# Patient Record
Sex: Male | Born: 1965
Health system: Southern US, Community
[De-identification: ages and names within clinical notes are randomized; demographics above are authoritative.]

## PROBLEM LIST (undated history)

## (undated) DIAGNOSIS — R0683 Snoring: Secondary | ICD-10-CM

## (undated) HISTORY — DX: Snoring: R06.83

---

## 2002-03-17 ENCOUNTER — Emergency Department (HOSPITAL_COMMUNITY): Admission: EM | Admit: 2002-03-17 | Discharge: 2002-03-17 | Payer: Self-pay | Admitting: Emergency Medicine

## 2002-08-08 ENCOUNTER — Emergency Department (HOSPITAL_COMMUNITY): Admission: EM | Admit: 2002-08-08 | Discharge: 2002-08-09 | Payer: Self-pay | Admitting: Emergency Medicine

## 2013-04-27 ENCOUNTER — Encounter: Payer: Self-pay | Admitting: Family Medicine

## 2014-02-17 ENCOUNTER — Ambulatory Visit (INDEPENDENT_AMBULATORY_CARE_PROVIDER_SITE_OTHER): Payer: 59 | Admitting: Family Medicine

## 2014-02-17 VITALS — BP 140/82 | HR 70 | Temp 98.0°F | Resp 16 | Ht 69.0 in | Wt 190.0 lb

## 2014-02-17 DIAGNOSIS — J209 Acute bronchitis, unspecified: Secondary | ICD-10-CM

## 2014-02-17 MED ORDER — HYDROCOD POLST-CHLORPHEN POLST 10-8 MG/5ML PO LQCR: 5.0000 mL | Freq: Two times a day (BID) | ORAL | Status: DC | PRN

## 2014-02-17 MED ORDER — AZITHROMYCIN 250 MG PO TABS: ORAL_TABLET | ORAL | Status: DC

## 2014-02-17 NOTE — Patient Instructions (Signed)

## 2014-02-17 NOTE — Progress Notes (Signed)
   Subjective:    Patient ID: Willie Ferguson, male    DOB: Mar 08, 1966, 48 y.o.   MRN: [id number] This chart was scribed for Elvina SidleKurt Pedrohenrique Mccon[place], MD by Littie Deedsichard Sun, Medical Scribe. This patient was seen in Room 2 and the patient's care was started at 8:33 AM.   HPI HPI Comments: Willie Ferguson is a 48 y.o. male who presents to the [facility] complaining of gradual onset URI symptoms that began 6 days ago. He initially had a sore throat, then developed a cough that began last night. Patient has tried Halls and Mucinex for his symptoms. He notes that his wife has also been experiencing similar symptoms. He denies fever, chills, diaphoresis. He denies smoking and hx of lung problems.  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    Review of Systems  Constitutional: Negative for fever, chills and diaphoresis.  HENT: Positive for sore throat.   Eyes: Negative for discharge.  Respiratory: Positive for cough.   Cardiovascular: Negative for chest pain.  Genitourinary: Negative for hematuria.  Musculoskeletal: Negative for back pain.  Skin: Negative for rash.  Neurological: Negative for seizures.  Psychiatric/Behavioral: Negative for hallucinations.       Objective:   Physical Exam CONSTITUTIONAL: Well developed/well nourished HEAD: Normocephalic/atraumatic EYES: EOM/PERRL ENMT: Mucous membranes moist. Patchy erythema on hard palate. NECK: supple no meningeal signs SPINE: entire spine nontender CV: S1/S2 noted, no murmurs/rubs/gallops noted LUNGS: Bibasilar rales ABDOMEN: soft, nontender, no rebound or guarding GU: no cva tenderness NEURO: Pt is awake/alert, moves all extremitiesx4 EXTREMITIES: pulses normal, full ROM SKIN: warm, color normal.  PSYCH: no abnormalities of mood noted        Assessment & Plan:   Acute bronchitis, unspecified organism - Plan: chlorpheniramine-HYDROcodone (TUSSIONEX PENNKINETIC ER) 10-8 MG/5ML LQCR, azithromycin (ZITHROMAX Z-PAK) 250  MG tablet  Signed, Elvina SidleKurt Jahrell Hamor, MD

## 2014-08-25 ENCOUNTER — Ambulatory Visit (INDEPENDENT_AMBULATORY_CARE_PROVIDER_SITE_OTHER): Payer: 59

## 2014-08-25 ENCOUNTER — Ambulatory Visit (INDEPENDENT_AMBULATORY_CARE_PROVIDER_SITE_OTHER): Payer: 59 | Admitting: Family Medicine

## 2014-08-25 VITALS — BP 134/80 | HR 65 | Temp 97.9°F | Resp 18 | Ht 67.0 in | Wt 195.0 lb

## 2014-08-25 DIAGNOSIS — M25512 Pain in left shoulder: Secondary | ICD-10-CM

## 2014-08-25 DIAGNOSIS — M7542 Impingement syndrome of left shoulder: Secondary | ICD-10-CM

## 2014-08-25 DIAGNOSIS — M7522 Bicipital tendinitis, left shoulder: Secondary | ICD-10-CM

## 2014-08-25 MED ORDER — MELOXICAM 15 MG PO TABS: 15.0000 mg | ORAL_TABLET | Freq: Every day | ORAL | Status: DC

## 2014-08-25 NOTE — Patient Instructions (Addendum)
Ice the shoulder 3-4 times per day for 15 minutes.  Make sure you use a barrier between the skin and ice. Please take the mobic daily.  Do not use other NSAIDs with it such as ibuprofen, naproxen, etc. This is a process that improves gradually, so stick to your stretches and exercise.     Impingement Syndrome, Rotator Cuff, Bursitis with Rehab Impingement syndrome is a condition that involves inflammation of the tendons of the rotator cuff and the subacromial bursa, that causes pain in the shoulder. The rotator cuff consists of four tendons and muscles that control much of the shoulder and upper arm function. The subacromial bursa is a fluid filled sac that helps reduce friction between the rotator cuff and one of the bones of the shoulder (acromion). Impingement syndrome is usually an overuse injury that causes swelling of the bursa (bursitis), swelling of the tendon (tendonitis), and/or a tear of the tendon (strain). Strains are classified into three categories. Grade 1 strains cause pain, but the tendon is not lengthened. Grade 2 strains include a lengthened ligament, due to the ligament being stretched or partially ruptured. With grade 2 strains there is still function, although the function may be decreased. Grade 3 strains include a complete tear of the tendon or muscle, and function is usually impaired. SYMPTOMS   Pain around the shoulder, often at the outer portion of the upper arm.  Pain that gets worse with shoulder function, especially when reaching overhead or lifting.  Sometimes, aching when not using the arm.  Pain that wakes you up at night.  Sometimes, tenderness, swelling, warmth, or redness over the affected area.  Loss of strength.  Limited motion of the shoulder, especially reaching behind the back (to the back pocket or to unhook bra) or across your body.  Crackling sound (crepitation) when moving the arm.  Biceps tendon pain and inflammation (in the front of the  shoulder). Worse when bending the elbow or lifting. CAUSES  Impingement syndrome is often an overuse injury, in which chronic (repetitive) motions cause the tendons or bursa to become inflamed. A strain occurs when a force is paced on the tendon or muscle that is greater than it can withstand. Common mechanisms of injury include: Stress from sudden increase in duration, frequency, or intensity of training.  Direct hit (trauma) to the shoulder.  Aging, erosion of the tendon with normal use.  Bony bump on shoulder (acromial spur). RISK INCREASES WITH:  Contact sports (football, wrestling, boxing).  Throwing sports (baseball, tennis, volleyball).  Weightlifting and bodybuilding.  Heavy labor.  Previous injury to the rotator cuff, including impingement.  Poor shoulder strength and flexibility.  Failure to warm up properly before activity.  Inadequate protective equipment.  Old age.  Bony bump on shoulder (acromial spur). PREVENTION   Warm up and stretch properly before activity.  Allow for adequate recovery between workouts.  Maintain physical fitness:  Strength, flexibility, and endurance.  Cardiovascular fitness.  Learn and use proper exercise technique. PROGNOSIS  If treated properly, impingement syndrome usually goes away within 6 weeks. Sometimes surgery is required.  RELATED COMPLICATIONS   Longer healing time if not properly treated, or if not given enough time to heal.  Recurring symptoms, that result in a chronic condition.  Shoulder stiffness, frozen shoulder, or loss of motion.  Rotator cuff tendon tear.  Recurring symptoms, especially if activity is resumed too soon, with overuse, with a direct blow, or when using poor technique. TREATMENT  Treatment first involves the use  of ice and medicine, to reduce pain and inflammation. The use of strengthening and stretching exercises may help reduce pain with activity. These exercises may be performed at home  or with a therapist. If non-surgical treatment is unsuccessful after more than 6 months, surgery may be advised. After surgery and rehabilitation, activity is usually possible in 3 months.  MEDICATION  If pain medicine is needed, nonsteroidal anti-inflammatory medicines (aspirin and ibuprofen), or other minor pain relievers (acetaminophen), are often advised.  Do not take pain medicine for 7 days before surgery.  Prescription pain relievers may be given, if your caregiver thinks they are needed. Use only as directed and only as much as you need.  Corticosteroid injections may be given by your caregiver. These injections should be reserved for the most serious cases, because they may only be given a certain number of times. HEAT AND COLD  Cold treatment (icing) should be applied for 10 to 15 minutes every 2 to 3 hours for inflammation and pain, and immediately after activity that aggravates your symptoms. Use ice packs or an ice massage.  Heat treatment may be used before performing stretching and strengthening activities prescribed by your caregiver, physical therapist, or athletic trainer. Use a heat pack or a warm water soak. SEEK MEDICAL CARE IF:   Symptoms get worse or do not improve in 4 to 6 weeks, despite treatment.  New, unexplained symptoms develop. (Drugs used in treatment may produce side effects.) EXERCISES  RANGE OF MOTION (ROM) AND STRETCHING EXERCISES - Impingement Syndrome (Rotator Cuff  Tendinitis, Bursitis) These exercises may help you when beginning to rehabilitate your injury. Your symptoms may go away with or without further involvement from your physician, physical therapist or athletic trainer. While completing these exercises, remember:   Restoring tissue flexibility helps normal motion to return to the joints. This allows healthier, less painful movement and activity.  An effective stretch should be held for at least 30 seconds.  A stretch should never be  painful. You should only feel a gentle lengthening or release in the stretched tissue. STRETCH - Flexion, Standing  Stand with good posture. With an underhand grip on your right / left hand, and an overhand grip on the opposite hand, grasp a broomstick or cane so that your hands are a [place] more than shoulder width apart.  Keeping your right / left elbow straight and shoulder muscles relaxed, push the stick with your opposite hand, to raise your right / left arm in front of your body and then overhead. Raise your arm until you feel a stretch in your right / left shoulder, but before you have increased shoulder pain.  Try to avoid shrugging your right / left shoulder as your arm rises, by keeping your shoulder blade tucked down and toward your mid-back spine. Hold for __________ seconds.  Slowly return to the starting position. Repeat __________ times. Complete this exercise __________ times per day. STRETCH - Abduction, Supine  Lie on your back. With an underhand grip on your right / left hand and an overhand grip on the opposite hand, grasp a broomstick or cane so that your hands are a [place] more than shoulder width apart.  Keeping your right / left elbow straight and your shoulder muscles relaxed, push the stick with your opposite hand, to raise your right / left arm out to the side of your body and then overhead. Raise your arm until you feel a stretch in your right / left shoulder, but before you have  increased shoulder pain.  Try to avoid shrugging your right / left shoulder as your arm rises, by keeping your shoulder blade tucked down and toward your mid-back spine. Hold for __________ seconds.  Slowly return to the starting position. Repeat __________ times. Complete this exercise __________ times per day. ROM - Flexion, Active-Assisted  Lie on your back. You may bend your knees for comfort.  Grasp a broomstick or cane so your hands are about shoulder width apart. Your right / left  hand should grip the end of the stick, so that your hand is positioned "thumbs-up," as if you were about to shake hands.  Using your healthy arm to lead, raise your right / left arm overhead, until you feel a gentle stretch in your shoulder. Hold for __________ seconds.  Use the stick to assist in returning your right / left arm to its starting position. Repeat __________ times. Complete this exercise __________ times per day.  ROM - Internal Rotation, Supine   Lie on your back on a firm surface. Place your right / left elbow about 60 degrees away from your side. Elevate your elbow with a folded towel, so that the elbow and shoulder are the same height.  Using a broomstick or cane and your strong arm, pull your right / left hand toward your body until you feel a gentle stretch, but no increase in your shoulder pain. Keep your shoulder and elbow in place throughout the exercise.  Hold for __________ seconds. Slowly return to the starting position. Repeat __________ times. Complete this exercise __________ times per day. STRETCH - Internal Rotation  Place your right / left hand behind your back, palm up.  Throw a towel or belt over your opposite shoulder. Grasp the towel with your right / left hand.  While keeping an upright posture, gently pull up on the towel, until you feel a stretch in the front of your right / left shoulder.  Avoid shrugging your right / left shoulder as your arm rises, by keeping your shoulder blade tucked down and toward your mid-back spine.  Hold for __________ seconds. Release the stretch, by lowering your healthy hand. Repeat __________ times. Complete this exercise __________ times per day. ROM - Internal Rotation   Using an underhand grip, grasp a stick behind your back with both hands.  While standing upright with good posture, slide the stick up your back until you feel a mild stretch in the front of your shoulder.  Hold for __________ seconds. Slowly  return to your starting position. Repeat __________ times. Complete this exercise __________ times per day.  STRETCH - Posterior Shoulder Capsule   Stand or sit with good posture. Grasp your right / left elbow and draw it across your chest, keeping it at the same height as your shoulder.  Pull your elbow, so your upper arm comes in closer to your chest. Pull until you feel a gentle stretch in the back of your shoulder.  Hold for __________ seconds. Repeat __________ times. Complete this exercise __________ times per day. STRENGTHENING EXERCISES - Impingement Syndrome (Rotator Cuff Tendinitis, Bursitis) These exercises may help you when beginning to rehabilitate your injury. They may resolve your symptoms with or without further involvement from your physician, physical therapist or athletic trainer. While completing these exercises, remember:  Muscles can gain both the endurance and the strength needed for everyday activities through controlled exercises.  Complete these exercises as instructed by your physician, physical therapist or athletic trainer. Increase the resistance and  repetitions only as guided.  You may experience muscle soreness or fatigue, but the pain or discomfort you are trying to eliminate should never worsen during these exercises. If this pain does get worse, stop and make sure you are following the directions exactly. If the pain is still present after adjustments, discontinue the exercise until you can discuss the trouble with your clinician.  During your recovery, avoid activity or exercises which involve actions that place your injured hand or elbow above your head or behind your back or head. These positions stress the tissues which you are trying to heal. STRENGTH - Scapular Depression and Adduction   With good posture, sit on a firm chair. Support your arms in front of you, with pillows, arm rests, or on a table top. Have your elbows in line with the sides of your  body.  Gently draw your shoulder blades down and toward your mid-back spine. Gradually increase the tension, without tensing the muscles along the top of your shoulders and the back of your neck.  Hold for __________ seconds. Slowly release the tension and relax your muscles completely before starting the next repetition.  After you have practiced this exercise, remove the arm support and complete the exercise in standing as well as sitting position. Repeat __________ times. Complete this exercise __________ times per day.  STRENGTH - Shoulder Abductors, Isometric  With good posture, stand or sit about 4-6 inches from a wall, with your right / left side facing the wall.  Bend your right / left elbow. Gently press your right / left elbow into the wall. Increase the pressure gradually, until you are pressing as hard as you can, without shrugging your shoulder or increasing any shoulder discomfort.  Hold for __________ seconds.  Release the tension slowly. Relax your shoulder muscles completely before you begin the next repetition. Repeat __________ times. Complete this exercise __________ times per day.  STRENGTH - External Rotators, Isometric  Keep your right / left elbow at your side and bend it 90 degrees.  Step into a door frame so that the outside of your right / left wrist can press against the door frame without your upper arm leaving your side.  Gently press your right / left wrist into the door frame, as if you were trying to swing the back of your hand away from your stomach. Gradually increase the tension, until you are pressing as hard as you can, without shrugging your shoulder or increasing any shoulder discomfort.  Hold for __________ seconds.  Release the tension slowly. Relax your shoulder muscles completely before you begin the next repetition. Repeat __________ times. Complete this exercise __________ times per day.  STRENGTH - Supraspinatus   Stand or sit with good  posture. Grasp a __________ weight, or an exercise band or tubing, so that your hand is "thumbs-up," like you are shaking hands.  Slowly lift your right / left arm in a "V" away from your thigh, diagonally into the space between your side and straight ahead. Lift your hand to shoulder height or as far as you can, without increasing any shoulder pain. At first, many people do not lift their hands above shoulder height.  Avoid shrugging your right / left shoulder as your arm rises, by keeping your shoulder blade tucked down and toward your mid-back spine.  Hold for __________ seconds. Control the descent of your hand, as you slowly return to your starting position. Repeat __________ times. Complete this exercise __________ times per day.  STRENGTH - External Rotators  Secure a rubber exercise band or tubing to a fixed object (table, pole) so that it is at the same height as your right / left elbow when you are standing or sitting on a firm surface.  Stand or sit so that the secured exercise band is at your uninjured side.  Bend your right / left elbow 90 degrees. Place a folded towel or small pillow under your right / left arm, so that your elbow is a few inches away from your side.  Keeping the tension on the exercise band, pull it away from your body, as if pivoting on your elbow. Be sure to keep your body steady, so that the movement is coming only from your rotating shoulder.  Hold for __________ seconds. Release the tension in a controlled manner, as you return to the starting position. Repeat __________ times. Complete this exercise __________ times per day.  STRENGTH - Internal Rotators   Secure a rubber exercise band or tubing to a fixed object (table, pole) so that it is at the same height as your right / left elbow when you are standing or sitting on a firm surface.  Stand or sit so that the secured exercise band is at your right / left side.  Bend your elbow 90 degrees. Place a  folded towel or small pillow under your right / left arm so that your elbow is a few inches away from your side.  Keeping the tension on the exercise band, pull it across your body, toward your stomach. Be sure to keep your body steady, so that the movement is coming only from your rotating shoulder.  Hold for __________ seconds. Release the tension in a controlled manner, as you return to the starting position. Repeat __________ times. Complete this exercise __________ times per day.  STRENGTH - Scapular Protractors, Standing   Stand arms length away from a wall. Place your hands on the wall, keeping your elbows straight.  Begin by dropping your shoulder blades down and toward your mid-back spine.  To strengthen your protractors, keep your shoulder blades down, but slide them forward on your rib cage. It will feel as if you are lifting the back of your rib cage away from the wall. This is a subtle motion and can be challenging to complete. Ask your caregiver for further instruction, if you are not sure you are doing the exercise correctly.  Hold for __________ seconds. Slowly return to the starting position, resting the muscles completely before starting the next repetition. Repeat __________ times. Complete this exercise __________ times per day. STRENGTH - Scapular Protractors, Supine  Lie on your back on a firm surface. Extend your right / left arm straight into the air while holding a __________ weight in your hand.  Keeping your head and back in place, lift your shoulder off the floor.  Hold for __________ seconds. Slowly return to the starting position, and allow your muscles to relax completely before starting the next repetition. Repeat __________ times. Complete this exercise __________ times per day. STRENGTH - Scapular Protractors, Quadruped  Get onto your hands and knees, with your shoulders directly over your hands (or as close as you can be, comfortably).  Keeping your  elbows locked, lift the back of your rib cage up into your shoulder blades, so your mid-back rounds out. Keep your neck muscles relaxed.  Hold this position for __________ seconds. Slowly return to the starting position and allow your muscles to relax completely  before starting the next repetition. Repeat __________ times. Complete this exercise __________ times per day.  STRENGTH - Scapular Retractors  Secure a rubber exercise band or tubing to a fixed object (table, pole), so that it is at the height of your shoulders when you are either standing, or sitting on a firm armless chair.  With a palm down grip, grasp an end of the band in each hand. Straighten your elbows and lift your hands straight in front of you, at shoulder height. Step back, away from the secured end of the band, until it becomes tense.  Squeezing your shoulder blades together, draw your elbows back toward your sides, as you bend them. Keep your upper arms lifted away from your body throughout the exercise.  Hold for __________ seconds. Slowly ease the tension on the band, as you reverse the directions and return to the starting position. Repeat __________ times. Complete this exercise __________ times per day. STRENGTH - Shoulder Extensors   Secure a rubber exercise band or tubing to a fixed object (table, pole) so that it is at the height of your shoulders when you are either standing, or sitting on a firm armless chair.  With a thumbs-up grip, grasp an end of the band in each hand. Straighten your elbows and lift your hands straight in front of you, at shoulder height. Step back, away from the secured end of the band, until it becomes tense.  Squeezing your shoulder blades together, pull your hands down to the sides of your thighs. Do not allow your hands to go behind you.  Hold for __________ seconds. Slowly ease the tension on the band, as you reverse the directions and return to the starting position. Repeat  __________ times. Complete this exercise __________ times per day.  STRENGTH - Scapular Retractors and External Rotators   Secure a rubber exercise band or tubing to a fixed object (table, pole) so that it is at the height as your shoulders, when you are either standing, or sitting on a firm armless chair.  With a palm down grip, grasp an end of the band in each hand. Bend your elbows 90 degrees and lift your elbows to shoulder height, at your sides. Step back, away from the secured end of the band, until it becomes tense.  Squeezing your shoulder blades together, rotate your shoulders so that your upper arms and elbows remain stationary, but your fists travel upward to head height.  Hold for __________ seconds. Slowly ease the tension on the band, as you reverse the directions and return to the starting position. Repeat __________ times. Complete this exercise __________ times per day.  STRENGTH - Scapular Retractors and External Rotators, Rowing   Secure a rubber exercise band or tubing to a fixed object (table, pole) so that it is at the height of your shoulders, when you are either standing, or sitting on a firm armless chair.  With a palm down grip, grasp an end of the band in each hand. Straighten your elbows and lift your hands straight in front of you, at shoulder height. Step back, away from the secured end of the band, until it becomes tense.  Step 1: Squeeze your shoulder blades together. Bending your elbows, draw your hands to your chest, as if you are rowing a boat. At the end of this motion, your hands and elbow should be at shoulder height and your elbows should be out to your sides.  Step 2: Rotate your shoulders, to raise your  hands above your head. Your forearms should be vertical and your upper arms should be horizontal.  Hold for __________ seconds. Slowly ease the tension on the band, as you reverse the directions and return to the starting position. Repeat __________  times. Complete this exercise __________ times per day.  STRENGTH - Scapular Depressors  Find a sturdy chair without wheels, such as a dining room chair.  Keeping your feet on the floor, and your hands on the chair arms, lift your bottom up from the seat, and lock your elbows.  Keeping your elbows straight, allow gravity to pull your body weight down. Your shoulders will rise toward your ears.  Raise your body against gravity by drawing your shoulder blades down your back, shortening the distance between your shoulders and ears. Although your feet should always maintain contact with the floor, your feet should progressively support less body weight, as you get stronger.  Hold for __________ seconds. In a controlled and slow manner, lower your body weight to begin the next repetition. Repeat __________ times. Complete this exercise __________ times per day.  Document Released: 03/24/2005 Document Revised: 06/16/2011 Document Reviewed: 07/06/2008 [facility] Patient Information 2015 [place], [place]. This information is not intended to replace advice given to you by your health care provider. Make sure you discuss any questions you have with your health care provider.

## 2014-08-25 NOTE — Progress Notes (Signed)
[facility] [address], [place] [id number] [phone]  Date:  08/25/2014   Name:  Willie Ferguson   DOB:  March 09, 1966   MRN:  [id number]  PCP:  No PCP Per Patient    Chief Complaint: Shoulder Pain   History of Present Illness:  Willie Ferguson is a 49 y.o. very pleasant male patient who presents with the following:  Patient reports left sided shoulder pain that started 4 days ago, when he woke up.  He describes as a pinching sensation that occurs intermittently.  He states that he feels some warmth, and his wife thought there was some swelling.  It is aggravated by turning a truck wheel counter clockwise and flexion.  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  He hass no extremity numbness or tingling.  He reports hand cramping.  He has tried 400-600 mg Advil tablets at night only, which helps some.    There are no active problems to display for this patient.   History reviewed. No pertinent past medical history.  History reviewed. No pertinent past surgical history.  History  Substance Use Topics  . Smoking status: Never Smoker   . Smokeless tobacco: Not on file  . Alcohol Use: Not on file    History reviewed. No pertinent family history.  No Known Allergies  Medication list has been reviewed and updated.  Current Outpatient Prescriptions on File Prior to Visit  Medication Sig Dispense Refill  . azithromycin (ZITHROMAX Z-PAK) 250 MG tablet Take as directed on pack (Patient not taking: Reported on 08/25/2014) 6 tablet 0  . chlorpheniramine-HYDROcodone (TUSSIONEX PENNKINETIC ER) 10-8 MG/5ML LQCR Take 5 mLs by mouth every 12 (twelve) hours as needed. (Patient not taking: Reported on 08/25/2014) 115 mL 0   No current facility-administered medications on file prior to visit.    Review of Systems: ROS otherwise unremarkable unless listed above.  Physical Examination: Filed Vitals:   08/25/14 0817  BP: 134/80  Pulse: 65  Temp: 97.9  F (36.6 C)  Resp: 18   Filed Vitals:   08/25/14 0817  Height: 5\' 7"  (1.702 m)  Weight: 195 lb (88.451 kg)   Body mass index is 30.53 kg/(m^2). Ideal Body Weight: Weight in (lb) to have BMI = 25: 159.3 Physical Exam  Constitutional: He is oriented to person, place, and time. He appears well-developed and well-nourished.  Eyes: EOM are normal. Pupils are equal, round, and reactive to light.  Neck: Normal range of motion. Neck supple. No spinous process tenderness present. Normal range of motion present.  Cardiovascular: Normal rate.   Pulmonary/Chest: Effort normal. No respiratory distress.  Musculoskeletal:       Right shoulder: Normal. He exhibits normal range of motion and normal strength.  Left shoulder: No observed swelling or erythema.  There is a slight warmth to the anterior shoulder at the origin of pain.  There is tendereness at the bicep's groove.  Normal range of motion throughout all planes.  He has pain with external rotation.  He has some pain incited with lift off, but no demonstrated weakness.  Onset of pain with Neer's test extended to 100 degrees.  Positive Speed's test.  Negative Hawkins.  Negative Tinel and Phalen.  Lymphadenopathy:    He has no cervical adenopathy.  Neurological: He is alert and oriented to person, place, and time.  Skin: Skin is warm and dry.  Psychiatric: He has a normal mood and affect. His behavior is normal.  .  [facility] reading (PRIMARY) by  Dr. Neva SeatGreene: [facility]  Assessment and Plan: 49 year old is here today for left shoulder pain.  Physical exam performed by Dr. Neva SeatGreene and I.  This appears to be some tendinopathy of the biceps tendon with shoulder impingement.  Strength is maintained throughout.  We will treat conservatively at this time. -I have advised to ice 3x per day for 15 minutes and as much rest to the arm as possible. -Mobic without other NSAID use. -Stretches verbally discussed and with  handout. -Pain continues after 4 weeks, will refer to ortho.   Biceps tendonitis on left  Shoulder impingement syndrome, left  Left anterior shoulder pain - Plan: DG Shoulder Left, meloxicam (MOBIC) 15 MG tablet  Trena PlattStephanie English, PA-C [facility] 5/23/20167:36 AM

## 2014-08-31 NOTE — Progress Notes (Signed)
Xray read and patient discussed and examined with Ms. English. Agree with assessment and plan of care per her note.

## 2014-12-27 ENCOUNTER — Ambulatory Visit (INDEPENDENT_AMBULATORY_CARE_PROVIDER_SITE_OTHER): Payer: Commercial Managed Care - HMO | Admitting: Family Medicine

## 2014-12-27 VITALS — BP 138/82 | HR 68 | Temp 98.6°F | Resp 18 | Ht 69.0 in | Wt 198.0 lb

## 2014-12-27 DIAGNOSIS — H6981 Other specified disorders of Eustachian tube, right ear: Secondary | ICD-10-CM

## 2014-12-27 DIAGNOSIS — R42 Dizziness and giddiness: Secondary | ICD-10-CM | POA: Diagnosis not present

## 2014-12-27 MED ORDER — FLUTICASONE PROPIONATE 50 MCG/ACT NA SUSP: 2.0000 | Freq: Every day | NASAL | Status: DC

## 2014-12-27 MED ORDER — MECLIZINE HCL 25 MG PO TABS: 25.0000 mg | ORAL_TABLET | Freq: Three times a day (TID) | ORAL | Status: DC | PRN

## 2014-12-27 NOTE — Progress Notes (Signed)
Patient ID: Willie Ferguson, male    DOB: January 26, 1966  Age: 49 y.o. MRN: [id number]  Chief Complaint  Patient presents with  . Hypertension  . Headache    x1 week now   . Dizziness    x1 week when holding head down    Subjective:   Patient is here with history of about a week or so of having some mild headaches but mostly a dizziness that is positional occasionally when he turns over in bed he notices it, most time it is usually self. He has no other major complaints. He has had a Gravelle head congestion. He was on a plane to [place] and back. He does not smoke, occasionally drinks. Not on any regular medicines. No regular OTC medications.   Current allergies, medications, problem list, past/family and social histories reviewed.  Objective:  BP 138/82 mmHg  Pulse 68  Temp(Src) 98.6 F (37 C) (Oral)  Resp 18  Ht  (1.753 m)  Wt 198 lb (89.812 kg)  BMI 29.23 kg/m2  SpO2 98%  No acute distress. TMs normal left. The right is a Dunker red along the umbo, like he had a tiny bit of bleeding into the edge of the drum. Suspicious for having had a eardrum that had pressure changes when he was flying. His throat is clear. Neck supple without nodes thyromegaly. Fundi benign. Chest clear. Heart regular without murmurs. Romberg negative.  Assessment & Plan:   Assessment: 1. Dizziness   2. Eustachian tube dysfunction, right       Plan: No orders of the defined types were placed in this encounter.    Meds ordered this encounter  Medications  . meclizine (ANTIVERT) 25 MG tablet    Sig: Take 1 tablet (25 mg total) by mouth 3 (three) times daily as needed for dizziness.    Dispense:  30 tablet    Refill:  0  . fluticasone (FLONASE) 50 MCG/ACT nasal spray    Sig: Place 2 sprays into both nostrils daily.    Dispense:  16 g    Refill:  6         Patient Instructions  Take meclizine 25 mg one half to one pill 3 times daily as needed for dizziness. This medicine may make your  [name] drowsy, so if you're driving you may want to just take one half of a pill.  Take the fluticasone nose spray 2 sprays in each nostril twice daily for 3 days, then cut back to once daily  Take acetaminophen (Tylenol) 500 mg 2 pills 3 times daily or ibuprofen 200 mg 3 pills 3 times daily as needed for headache  Return if not improving over the next week. Sooner if worse.    Return if symptoms worsen or fail to improve.   HOPPER,DAVID, MD 12/27/2014

## 2014-12-27 NOTE — Patient Instructions (Signed)
Take meclizine 25 mg one half to one pill 3 times daily as needed for dizziness. This medicine may make your Renner drowsy, so if you're driving you may want to just take one half of a pill.  Take the fluticasone nose spray 2 sprays in each nostril twice daily for 3 days, then cut back to once daily  Take acetaminophen (Tylenol) 500 mg 2 pills 3 times daily or ibuprofen 200 mg 3 pills 3 times daily as needed for headache  Return if not improving over the next week. Sooner if worse.

## 2015-02-27 ENCOUNTER — Encounter: Payer: Self-pay | Admitting: Physician Assistant

## 2015-02-27 ENCOUNTER — Ambulatory Visit (INDEPENDENT_AMBULATORY_CARE_PROVIDER_SITE_OTHER): Payer: Commercial Managed Care - HMO | Admitting: Physician Assistant

## 2015-02-27 VITALS — BP 136/94 | HR 73 | Temp 98.7°F | Resp 16 | Ht 68.0 in | Wt 202.0 lb

## 2015-02-27 DIAGNOSIS — R5383 Other fatigue: Secondary | ICD-10-CM | POA: Diagnosis not present

## 2015-02-27 DIAGNOSIS — Z131 Encounter for screening for diabetes mellitus: Secondary | ICD-10-CM

## 2015-02-27 DIAGNOSIS — Z13220 Encounter for screening for lipoid disorders: Secondary | ICD-10-CM | POA: Diagnosis not present

## 2015-02-27 DIAGNOSIS — Z125 Encounter for screening for malignant neoplasm of prostate: Secondary | ICD-10-CM

## 2015-02-27 DIAGNOSIS — R0681 Apnea, not elsewhere classified: Secondary | ICD-10-CM

## 2015-02-27 DIAGNOSIS — R030 Elevated blood-pressure reading, without diagnosis of hypertension: Secondary | ICD-10-CM

## 2015-02-27 DIAGNOSIS — Z0000 Encounter for general adult medical examination without abnormal findings: Secondary | ICD-10-CM

## 2015-02-27 DIAGNOSIS — IMO0001 Reserved for inherently not codable concepts without codable children: Secondary | ICD-10-CM

## 2015-02-27 LAB — POC MICROSCOPIC URINALYSIS (UMFC): Mucus: ABSENT

## 2015-02-27 LAB — COMPREHENSIVE METABOLIC PANEL
ALT: 28 U/L (ref 9–46)
AST: 28 U/L (ref 10–40)
Albumin: 4.1 g/dL (ref 3.6–5.1)
Alkaline Phosphatase: 36 U/L — ABNORMAL LOW (ref 40–115)
BUN: 6 mg/dL — ABNORMAL LOW (ref 7–25)
CO2: 27 mmol/L (ref 20–31)
Calcium: 9.3 mg/dL (ref 8.6–10.3)
Chloride: 103 mmol/L (ref 98–110)
Creat: 0.88 mg/dL (ref 0.60–1.35)
Glucose, Bld: 90 mg/dL (ref 65–99)
Potassium: 4.2 mmol/L (ref 3.5–5.3)
Sodium: 140 mmol/L (ref 135–146)
Total Bilirubin: 0.4 mg/dL (ref 0.2–1.2)
Total Protein: 7 g/dL (ref 6.1–8.1)

## 2015-02-27 LAB — CBC
HCT: 44.8 % (ref 39.0–52.0)
Hemoglobin: 15.6 g/dL (ref 13.0–17.0)
MCH: 29.1 pg (ref 26.0–34.0)
MCHC: 34.8 g/dL (ref 30.0–36.0)
MCV: 83.4 fL (ref 78.0–100.0)
MPV: 10.8 fL (ref 8.6–12.4)
Platelets: 214 10*3/uL (ref 150–400)
RBC: 5.37 MIL/uL (ref 4.22–5.81)
RDW: 14.3 % (ref 11.5–15.5)
WBC: 6.4 10*3/uL (ref 4.0–10.5)

## 2015-02-27 LAB — POCT URINALYSIS DIP (MANUAL ENTRY)
Bilirubin, UA: NEGATIVE
Glucose, UA: NEGATIVE
Ketones, POC UA: NEGATIVE
Leukocytes, UA: NEGATIVE
Nitrite, UA: NEGATIVE
Protein Ur, POC: NEGATIVE
Spec Grav, UA: 1.02
Urobilinogen, UA: 1
pH, UA: 5.5

## 2015-02-27 LAB — TSH: TSH: 2.211 u[IU]/mL (ref 0.350–4.500)

## 2015-02-27 LAB — LIPID PANEL
Cholesterol: 183 mg/dL (ref 125–200)
HDL: 44 mg/dL (ref >=40)
LDL Cholesterol: 114 mg/dL (ref <130)
Total CHOL/HDL Ratio: 4.2 Ratio (ref <=5.0)
Triglycerides: 123 mg/dL (ref <150)
VLDL: 25 mg/dL (ref <30)

## 2015-02-27 NOTE — Patient Instructions (Signed)
You will get a phone call to make appt for sleep study I will call you with your lab results within the next 10 days. Take your blood pressure at home three times a week and keep a record. Goal of <140/90. If consistently above goal, we may need to consider medication for blood pressure. Make an appt with me after your sleep study and we will recheck blood pressure and re-evaluate. Consider getting tetanus vaccine

## 2015-02-27 NOTE — Progress Notes (Signed)
   Subjective:    Patient ID: Willie Ferguson, male    DOB: 11-22-1965, 49 y.o.   MRN: [id number]  HPI    Review of Systems  Constitutional: Negative.   HENT: Negative.   Eyes: Negative.   Respiratory: Negative.   Cardiovascular: Negative.   Gastrointestinal: Negative.   Endocrine: Negative.   Genitourinary: Negative.   Musculoskeletal: Negative.   Skin: Negative.   Allergic/Immunologic: Negative.   Neurological: Negative.   Hematological: Negative.   Psychiatric/Behavioral: Negative.        Objective:   Physical Exam        Assessment & Plan:

## 2015-02-27 NOTE — Progress Notes (Signed)
[facility] [address], [place] [address] [phone]  Date:  02/27/2015   Name:  Willie Ferguson   DOB:  1965-12-04   MRN:  [id number]  PCP:  No PCP Per Patient    Chief Complaint: Annual Exam   History of Present Illness:  This is a 49 y.o. male with no significant PMH who is presenting for CPE.  Pt endorses being tired all the time. He feels that he sleeps well at night but during the day he feels he could nod off at any time. Wife has told him that he snores very loudly and sometimes it sounds like he's "choking". His mother has OSA.  BP a Sanluis elevated today. He has a BP monitor at home but does not take his BP regularly. He states going to Dr office makes him nervous. He denies ha, cp, sob, palps, LE, blurred vision.  Immunizations: refuses tdap and flu vaccines Dentist: yes, regular visits Eye: no vision changes, wears glasses at night when driving Diet: eats pretty healthy, drinks three 16 oz regular pepsis each day.  Exercise: only exercise is walking at work. Has a fitbit and gets 10,000 steps almost every day. Planning to start going to the gym on the weekends Fam hx: two uncles and 1 aunt with lung cancer (all smokers), HTN mom and brother. No hx CAD, DM, CVA. No hx colon or prostate cancer. His wife is worried about his fam hx cancer Sexual hx: no problems with sex Urinary hesitancy/frequency or nocturia: gets up once a night to urinate Tobacco/alcohol/substance use: no/only on holidays/no Colonoscopy: age 72 d/t blood in stool and normal, on 10 year cycle. Drives a garbage truck for work. Mood is good.  Review of Systems:  Review of Systems See CMA note  There are no active problems to display for this patient.  Prior to Admission medications   Not on File    No Known Allergies  History reviewed. No pertinent past surgical history.  Social History  Substance Use Topics  . Smoking status: Never Smoker   . Smokeless tobacco:  None  . Alcohol Use: No    Family History  Problem Relation Age of Onset  . Hypertension Mother   . Hypertension Brother   . Hypertension Brother     Medication list has been reviewed and updated.  Physical Examination:  Physical Exam  Constitutional: He is oriented to person, place, and time. He appears well-developed and well-nourished. No distress.  HENT:  Head: Normocephalic and atraumatic.  Right Ear: Hearing, tympanic membrane, external ear and ear canal normal.  Left Ear: Hearing, tympanic membrane, external ear and ear canal normal.  Nose: Nose normal.  Mouth/Throat: Uvula is midline, oropharynx is clear and moist and mucous membranes are normal.  mallampati state 3  Eyes: Conjunctivae, EOM and lids are normal. Pupils are equal, round, and reactive to light. Right eye exhibits no discharge. Left eye exhibits no discharge. No scleral icterus.  Neck: Trachea normal. Carotid bruit is not present. No thyromegaly present.  Cardiovascular: Normal rate, regular rhythm, normal heart sounds and normal pulses.   No murmur heard. Pulmonary/Chest: Effort normal and breath sounds normal. No respiratory distress. He has no wheezes. He has no rhonchi. He has no rales.  Abdominal: Soft. Normal appearance. There is no tenderness.  Musculoskeletal: Normal range of motion.  Lymphadenopathy:       Head (right side): No submental, no submandibular and no tonsillar adenopathy present.  Head (left side): No submental, no submandibular and no tonsillar adenopathy present.    He has no cervical adenopathy.  Neurological: He is alert and oriented to person, place, and time. No cranial nerve deficit. Gait normal.  Skin: Skin is warm, dry and intact. No lesion and no rash noted.  Psychiatric: He has a normal mood and affect. His speech is normal and behavior is normal. Thought content normal.   BP 138/92 mmHg  Pulse 73  Temp(Src) 98.7 F (37.1 C) (Oral)  Resp 16  Ht 5\' 8"  (1.727 m)  Wt  202 lb (91.627 kg)  BMI 30.72 kg/m2   Visual Acuity Screening   Right eye Left eye Both eyes  Without correction: 20/20 20/20 20/20   With correction:      Assessment and Plan:  1. Annual physical exam Up to date on preventative medicine. Decline flu vaccine. We discussed the importance of cutting down on soda intake and making time for exercise outside of work.  - POCT urinalysis dipstick - POCT Microscopic Urinalysis ([facility])  2. Other fatigue 3. Witness apneic spells Will refer for sleep study. - CBC - TSH - Ambulatory referral to Sleep Studies  4. Lipid screening - Lipid panel  5. Screening for diabetes mellitus - Comprehensive metabolic panel  6. Screening for prostate cancer We discussed new screening recommendations. No rectal exam, since new recommendations age DRE. Will get baseline PSA. If normal, will repeat in 5 years. No fam hx prostate cancer and no obstructive symptoms. - PSA  7. Elevated BP Mildly elevated BP today. Advised he check his BP at home and keep record. If consistently >140/90, he will let me know. If he has OSA, starting on CPAP could correct this. He will make appt with me after his sleep study and will recheck BP.   Roswell MinersNicole V. Dyke BrackettBush, PA-C, MHS [facility]  03/03/2015

## 2015-02-28 LAB — PSA: PSA: 0.53 ng/mL (ref <=4.00)

## 2015-03-03 DIAGNOSIS — I10 Essential (primary) hypertension: Secondary | ICD-10-CM | POA: Insufficient documentation

## 2015-03-03 DIAGNOSIS — R0681 Apnea, not elsewhere classified: Secondary | ICD-10-CM | POA: Insufficient documentation

## 2015-03-03 DIAGNOSIS — R030 Elevated blood-pressure reading, without diagnosis of hypertension: Secondary | ICD-10-CM

## 2015-03-21 ENCOUNTER — Encounter: Payer: Self-pay | Admitting: Neurology

## 2015-03-21 ENCOUNTER — Ambulatory Visit (INDEPENDENT_AMBULATORY_CARE_PROVIDER_SITE_OTHER): Payer: Commercial Managed Care - HMO | Admitting: Neurology

## 2015-03-21 VITALS — BP 132/96 | HR 82 | Resp 20 | Ht 68.0 in | Wt 200.0 lb

## 2015-03-21 DIAGNOSIS — R0683 Snoring: Secondary | ICD-10-CM

## 2015-03-21 DIAGNOSIS — G4726 Circadian rhythm sleep disorder, shift work type: Secondary | ICD-10-CM

## 2015-03-21 NOTE — Progress Notes (Signed)
SLEEP MEDICINE CLINIC   Provider:  Melvyn Novas, M D  Referring Provider: Overton Mam Primary Care Physician: [address] [facility]  Chief Complaint  Patient presents with  . New Patient (Initial Visit)    wife reports loud snoring and witnessed apnea, rm 10, alone    HPI:  Willie Ferguson is a 49 y.o. male , seen here as a referral from [place] for a sleep evaluation,    Chief complaint according to patient : " waking up choking"   [name] reports that his wife has for long time certainly several years told him that he snores and has crescendo snoring his snoring getting louder and then finally ending up in a gasp or choking for air. This does not always wakes him up but his wife. 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 He works 14 hours daily. Often has trouble staying awake watching TV or having enough energy to do any kind of social activity.  Sleep habits are as follows: His regular job begins at 7 AM and usually ends between 3 and 4 PM. He still needs to be available until 5 PM for any kind of request duties and he stated that he spends that time in his truck and often falls asleep. By the time he is home which is after 5 he will have dinner and then returns to his part-time job from there. That usually goes from 5:30 PM to 9 PM. His second job is physically demanding; he cleans up, vacuums and removes trash. He is on finally back home at 10 PM and by 11 AM he is fast asleep. Often in front of the TV. The bedroom is described as cool, dark and usually quiet. The couple just the bedroom. The dog is not entering the bedroom. He prefers to sleep on his side, he usually resumes a prone position later during  Sleep. He sleeps with 2 pillows.  His wife has noted that there is a positional component to his snoring and she is bothered when he rolls  toward reports her.  He may be loudest in supine or on his side.  Sleep medical history and family sleep history:  Family history of hypertension, his older brother noticed during his childhood that he fought in his sleep or was active thrashing about. He was not known to sleep walk.  Social history: He drinks 3 caffeinated sodas per day, he drinks iced and sweet tea every day, no coffee. Average  of 6 caffeinated beverages daily. No tobacco use, no ETOH .   Review of Systems: Out of a complete 14 system review, the patient complains of only the following symptoms, and all other reviewed systems are negative.   Epworth score 11 , Fatigue severity score 9  , depression score 0   Social History   Social History  . Marital Status: Single    Spouse Name: N/A  . Number of Children: N/A  . Years of Education: N/A   Occupational History  .  [employer]   Social History Main Topics  . Smoking status: Never Smoker   . Smokeless tobacco: Not on file  . Alcohol Use: No  . Drug Use: No  . Sexual Activity: Not on file   Other Topics Concern  . Not on file   Social History Narrative  Drinks 3 16oz sodas daily.    Family History  Problem Relation Age of Onset  . Hypertension Mother   . Hypertension Brother   . Hypertension Brother     Past Medical History  Diagnosis Date  . Snoring     History reviewed. No pertinent past surgical history.  No current outpatient prescriptions on file.   No current facility-administered medications for this visit.    Allergies as of 03/21/2015  . (No Known Allergies)    Vitals: BP 132/96 mmHg  Pulse 82  Resp 20  Ht 5\' 8"  (1.727 m)  Wt 200 lb (90.719 kg)  BMI 30.42 kg/m2 Last Weight:  Wt Readings from Last 1 Encounters:  03/21/15 200 lb (90.719 kg)   NWG:NFAOBMI:Body mass index is 30.42 kg/(m^2).     Last Height:   Ht Readings from Last 1 Encounters:  03/21/15 5\' 8"  (1.727 m)    Physical exam:  General: The patient is  awake, alert and appears not in acute distress. The patient is well groomed. Head: Normocephalic, atraumatic. Neck is supple. Mallampati 4,  neck circumference: 16.25. Nasal airflow unrestricted , TMJ click is not evident . Retrognathia is seen.  Cardiovascular:  Regular rate and rhythm , without  murmurs or carotid bruit, and without distended neck veins. Respiratory: Lungs are clear to auscultation. Skin:  Without evidence of edema, or rash Trunk: BMI is elevated . The patient's posture is erect .  Neurologic exam : The patient is awake and alert, oriented to place and time.   Memory subjective described as intact.  Attention span & concentration ability appears normal.  Speech is fluent, without dysarthria, dysphonia or aphasia.  Mood and affect are appropriate.  Cranial nerves: Pupils are equal and briskly reactive to light. Funduscopic exam without evidence of pallor or edema.  Extraocular movements  in vertical and horizontal planes intact and without nystagmus. Visual fields by finger perimetry are intact. Hearing to finger rub intact.   Facial sensation intact to fine touch.  Facial motor strength is symmetric and tongue and uvula move midline. Shoulder shrug was symmetrical.   Motor exam:  Normal tone, muscle bulk and symmetric strength in all extremities.  Sensory:  Fine touch, pinprick and vibration were tested in all extremities. Proprioception tested in the upper extremities was normal.  Coordination: Rapid alternating movements in the fingers/hands was normal. Finger-to-nose maneuver  normal without evidence of ataxia, dysmetria or tremor.  Gait and station: Patient walks without assistive device and is able unassisted to climb up to the exam table. Strength within normal limits.  Stance is stable and normal.  Toe and heel stand were tested. Tandem gait is unfragmented. Turns with 3Steps. Romberg testing is negative.  Deep tendon reflexes: in the  upper and lower  extremities are symmetric and intact. Babinski maneuver response is downgoing.  The patient was advised of the nature of the diagnosed sleep disorder , the treatment options and risks for general a health and wellness arising from not treating the condition.  I spent more than 30 minutes of face to face time with the patient. Greater than 50% of time was spent in counseling and coordination of care. We have discussed the diagnosis and differential and I answered the patient's questions.     Assessment:  After physical and neurologic examination, review of laboratory studies,  Personal review of imaging studies, reports of other /same  Imaging studies ,  Results of polysomnography/ neurophysiology testing and pre-existing records as far as provided  in visit., my assessment is   1) Mr. Grave is not fatigued but he gets sleepy in the afternoon and evening hours.   Part of this is not because his sleep quality is poor but because he works long hours.  But his wife has noticed crescendo snoring and even witnessed some apneic gaps is the most reliable information to validate the need for a sleep test. Asian has no significant comorbidities or surgical history. I think that a home sleep test to screen him for apnea as the most reasonable next step. Is a home sleep test returns with a significant degree of apnea we can either outdoor titrate the patient or if he is not in favor of CPAP he may be a candidate for a dental device depending on the outcome of the test.   Given his rather benign sleep history I do not think that an attended sleep study as needed. He does not have frequent headaches, he does not wake up this diaphoresis office palpitations. And again he has not felt that his work performance as of anyway affected. He has no history of respiratory distress. No wheezing no rhonchi.   Plan:  Treatment plan and additional workup :  HST- RV after test.    Melvyn Novas MD  03/21/2015   CC:  Dorna Leitz, Pa-c [address] [place], [place] [address]

## 2015-03-21 NOTE — Patient Instructions (Signed)

## 2017-01-13 IMAGING — CR DG SHOULDER 2+V*L*
3 series · 3 of 3 positions shown · non-contrast
Comparison: None.

CLINICAL DATA: Shoulder pain of unknown origin. Initial evaluation
.

EXAM:
LEFT SHOULDER - 2+ VIEW

[AP (1 of 2)]
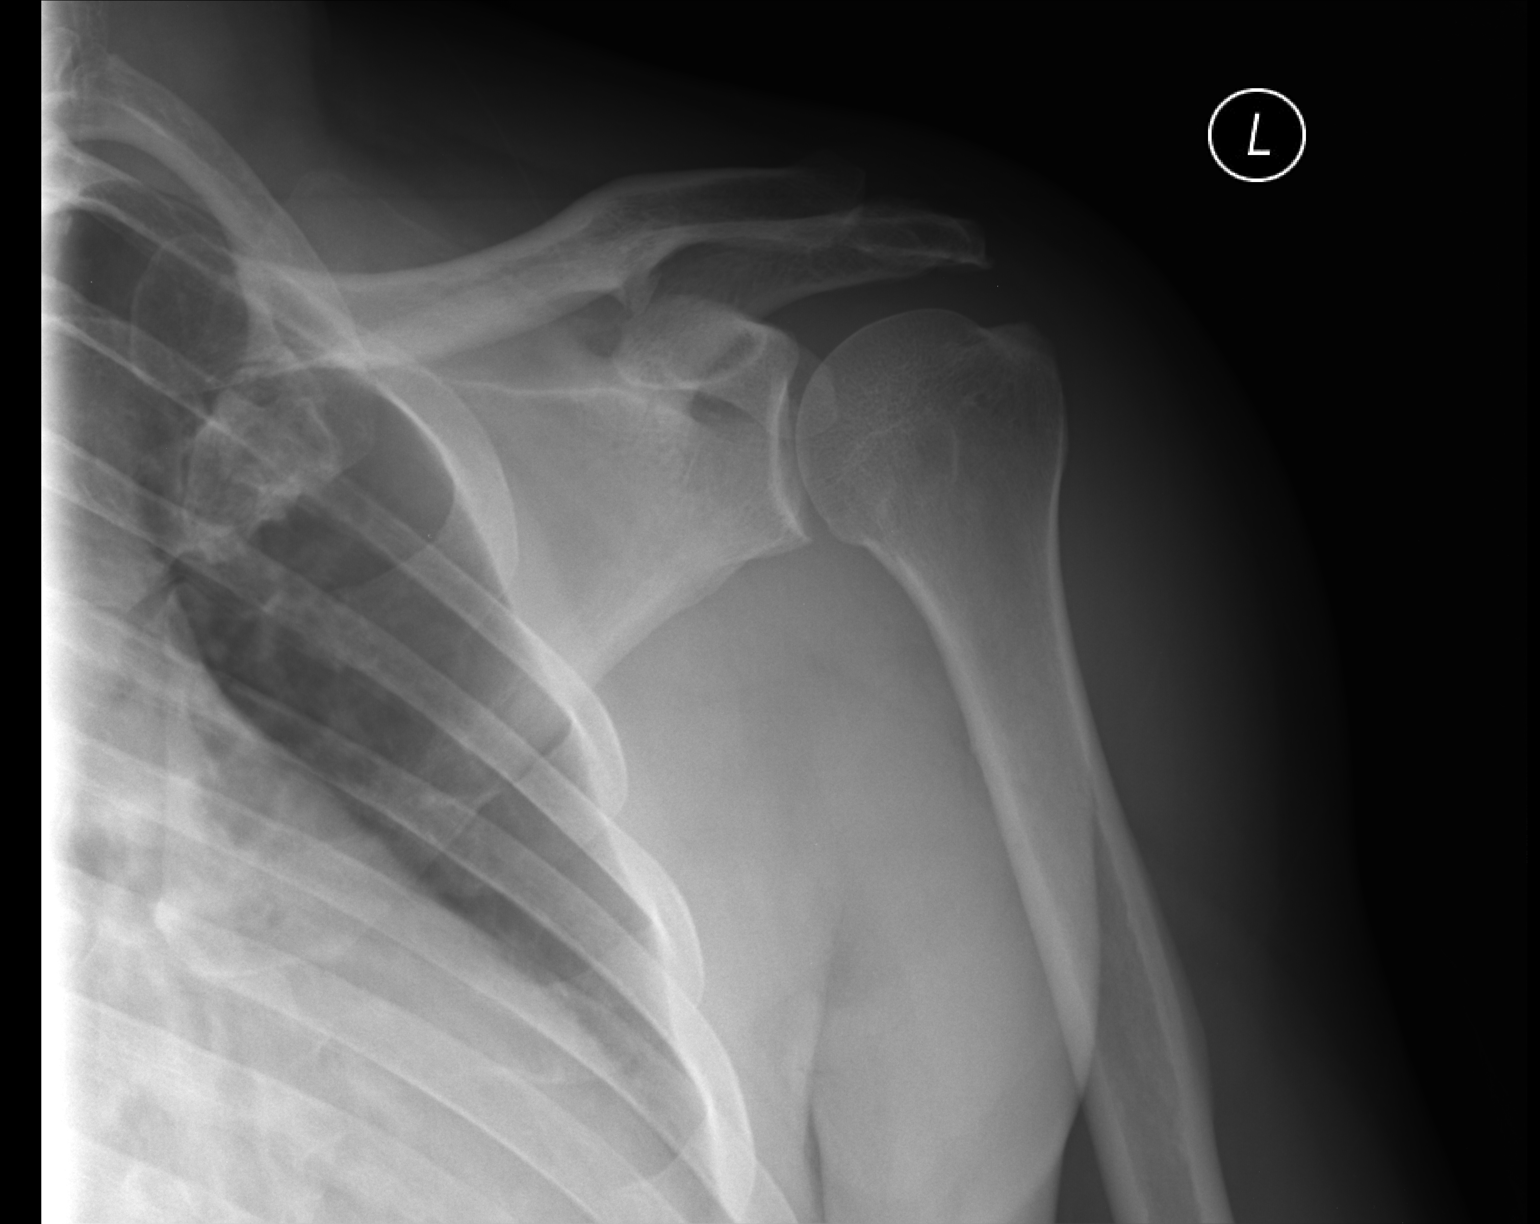

[axial]
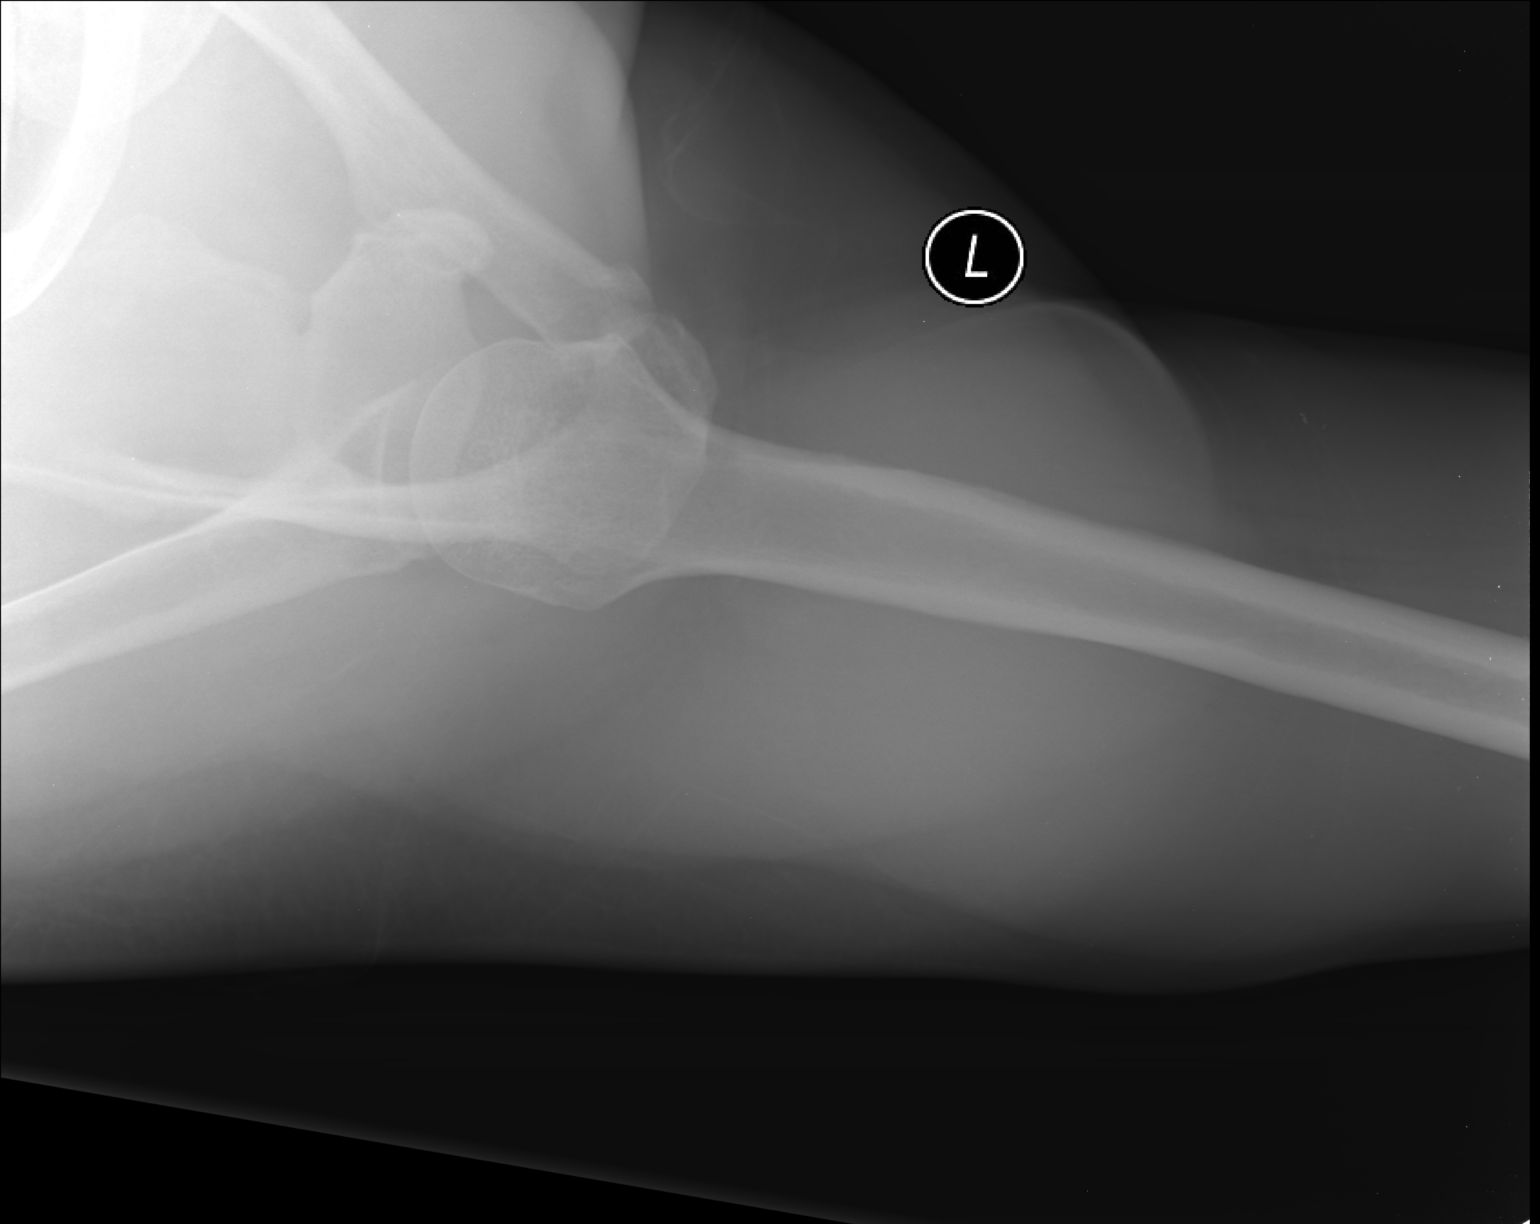

[AP (2 of 2)]
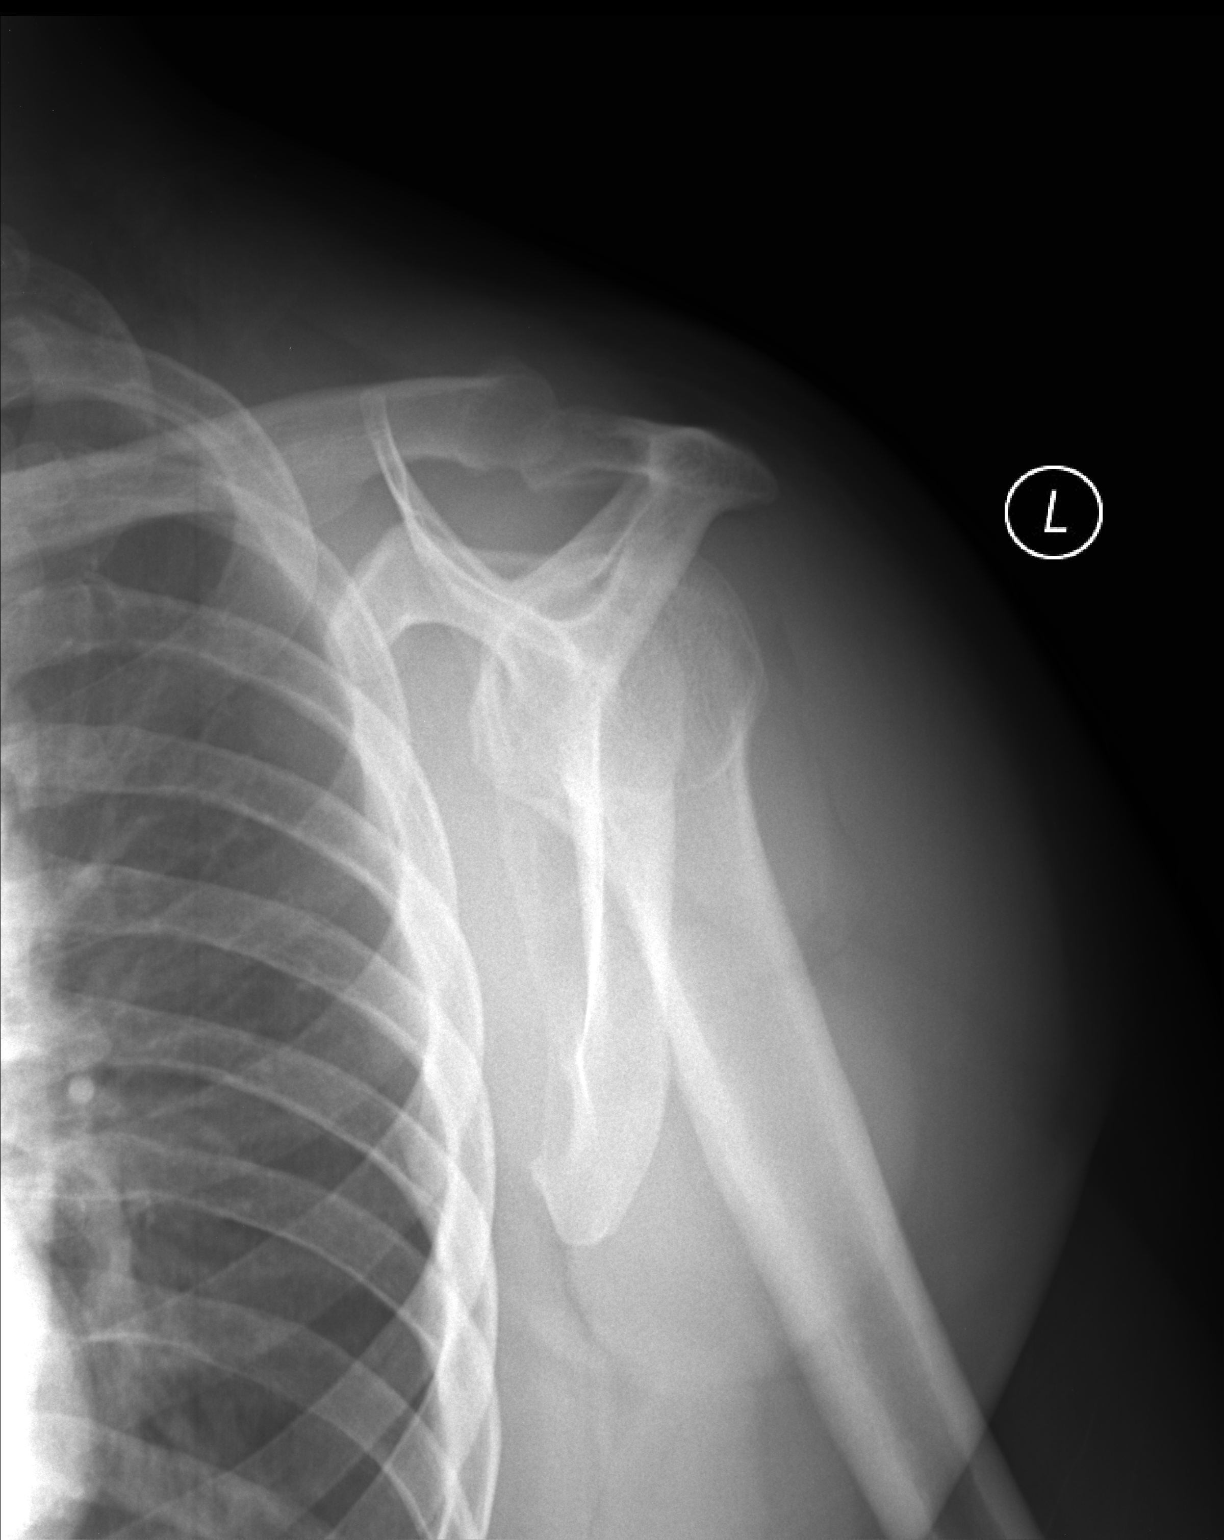

[3 of 3 positions shown; findings below may reference images not displayed]

FINDINGS: Mild acromioclavicular glenohumeral degenerative change. No evidence
of fracture, dislocation, or separation.
IMPRESSION: Mild acromioclavicular and glenohumeral degenerative change. No
acute abnormality identified.

## 2017-03-13 ENCOUNTER — Other Ambulatory Visit: Payer: Self-pay

## 2017-03-13 ENCOUNTER — Encounter (HOSPITAL_COMMUNITY): Payer: Self-pay

## 2017-03-13 ENCOUNTER — Ambulatory Visit (HOSPITAL_COMMUNITY)
Admission: EM | Admit: 2017-03-13 | Discharge: 2017-03-13 | Disposition: A | Discharge status: Home / Self Care | Payer: 59 | Attending: Internal Medicine | Admitting: Internal Medicine

## 2017-03-13 DIAGNOSIS — R059 Cough, unspecified: Secondary | ICD-10-CM

## 2017-03-13 DIAGNOSIS — R05 Cough: Secondary | ICD-10-CM

## 2017-03-13 MED ORDER — BENZONATATE 100 MG PO CAPS: 100.0000 mg | ORAL_CAPSULE | Freq: Three times a day (TID) | ORAL | 0 refills | Status: AC

## 2017-03-13 NOTE — ED Notes (Signed)
Patient discharged by provider.

## 2017-03-13 NOTE — ED Triage Notes (Signed)
[facility] productive cough and chills x3 days, pt has taken OTC medications but has no relief

## 2017-03-13 NOTE — ED Provider Notes (Signed)
[facility]    CSN: [id number] Arrival date & time: 03/13/17  1702     History   Chief Complaint Chief Complaint  Patient presents with  . Cough    HPI Audree CamelLarry Minella is a 51 y.o. male presenting with cough for 3 days. Today he started to have an itchy throat. Cough worse at night. Tried cough relief once. No other symptoms, Denies congestion, sore throat, shortness of breath, head ache, eye pain, chest pain, abdominal pain. Nausea, vomiting. No history of reflex, does not associate increase in cough/throat irritation to meals.   HPI  Past Medical History:  Diagnosis Date  . Snoring     Patient Active Problem List   Diagnosis Date Noted  . Elevated BP 03/03/2015  . Witnessed apneic spells 03/03/2015    History reviewed. No pertinent surgical history.     Home Medications    Prior to Admission medications   Medication Sig Start Date End Date Taking? Authorizing Provider  benzonatate (TESSALON) 100 MG capsule Take 1 capsule (100 mg total) by mouth every 8 (eight) hours for 7 days. 03/13/17 03/20/17  Wieters, Junius CreamerHallie C, PA-C    Family History Family History  Problem Relation Age of Onset  . Hypertension Mother   . Hypertension Brother   . Hypertension Brother     Social History Social History   Tobacco Use  . Smoking status: Never Smoker  . Smokeless tobacco: Never Used  Substance Use Topics  . Alcohol use: No    Alcohol/week: 0.0 oz  . Drug use: No     Allergies   Patient has no known allergies.   Review of Systems Review of Systems  Constitutional: Negative for chills, fatigue and fever.  HENT: Positive for voice change. Negative for congestion, ear pain, sinus pressure, sore throat and trouble swallowing.        Itchy throat  Eyes: Negative for pain, itching and visual disturbance.  Respiratory: Positive for cough. Negative for shortness of breath.   Cardiovascular: Negative for chest pain and palpitations.  Gastrointestinal:  Negative for abdominal pain, diarrhea, nausea and vomiting.  Musculoskeletal: Negative for back pain and neck pain.  Skin: Negative for rash.  Neurological: Negative for dizziness, syncope, light-headedness and headaches.  All other systems reviewed and are negative.    Physical Exam Triage Vital Signs ED Triage Vitals [03/13/17 1716]  Enc Vitals Group     BP (!) 149/86     Pulse Rate 79     Resp 16     Temp 97.6 F (36.4 C)     Temp Source Oral     SpO2 96 %     Weight      Height      Head Circumference      Peak Flow      Pain Score      Pain Loc      Pain Edu?      Excl. in GC?    No data found.  Updated Vital Signs BP (!) 149/86 (BP Location: Left Arm)   Pulse 79   Temp 97.6 F (36.4 C) (Oral)   Resp 16   SpO2 96%   Visual Acuity Right Eye Distance:   Left Eye Distance:   Bilateral Distance:    Right Eye Near:   Left Eye Near:    Bilateral Near:     Physical Exam  Constitutional: He is oriented to person, place, and time. He appears well-developed and well-nourished.  HENT:  Head: Normocephalic and atraumatic.  Right Ear: Tympanic membrane and ear canal normal. Tympanic membrane is not erythematous.  Left Ear: Tympanic membrane and ear canal normal. Tympanic membrane is not erythematous.  Mouth/Throat: Uvula is midline, oropharynx is clear and moist and mucous membranes are normal. No oral lesions. No trismus in the jaw. No uvula swelling. No posterior oropharyngeal erythema.  Eyes: Conjunctivae and EOM are normal.  Neck: Normal range of motion. Neck supple.  Cardiovascular: Normal rate and regular rhythm.  No murmur heard. Pulmonary/Chest: Effort normal and breath sounds normal. No respiratory distress. He has no wheezes. He has no rales.  Abdominal: Soft. He exhibits no distension. There is no tenderness. There is no guarding.  Musculoskeletal: He exhibits no edema.  Lymphadenopathy:    He has no cervical adenopathy.  Neurological: He is alert and  oriented to person, place, and time.  Skin: Skin is warm and dry.  Psychiatric: He has a normal mood and affect.  Nursing note and vitals reviewed.    UC Treatments / Results  Labs (all labs ordered are listed, but only abnormal results are displayed) Labs Reviewed - No data to display  EKG  EKG Interpretation None       Radiology No results found.  Procedures Procedures (including critical care time)  Medications Ordered in UC Medications - No data to display   Initial Impression / Assessment and Plan / UC Course  I have reviewed the triage vital signs and the nursing notes.  Pertinent labs & imaging results that were available during my care of the patient were reviewed by me and considered in my medical decision making (see chart for details).    Cough does not appear to be related to URI symptoms, or pulmonary disease. No tachycardia. Patient cough treated with Tessalon, advised honey tea. Cepacol lozenges and fluids for throat irritation. Return if symptoms not improving or development of chest pain, SOB, fever.    Final Clinical Impressions(s) / UC Diagnoses   Final diagnoses:  Cough    ED Discharge Orders        Ordered    benzonatate (TESSALON) 100 MG capsule  Every 8 hours     03/13/17 1733       Controlled Substance Prescriptions  Controlled Substance Registry consulted? Not Applicable   Lew DawesWieters, Hallie C, [place]PA-C 03/13/17 1746

## 2017-03-13 NOTE — Discharge Instructions (Signed)
Prescription for Tessalon Cough Pearls sent to [facility] on [address]  - For your itchy throat you may try cepacol lozenges, honey tea. Treatment of cough may also help your throat irritation.  - For cough you may try Tessalon, Robitussen, Mucinex DM  - Stay hydrated, drink plenty of fluids to keep throat coated and less irritated  Honey tea- for cough and throat For sore throat try using a honey-based tea. Use 3 teaspoons of honey with juice squeezed from half lemon. Place shaved pieces of ginger into 1/2-1 cup of water and warm over stove top. Then mix the ingredients and repeat every 4 hours as needed.

## 2017-03-14 ENCOUNTER — Ambulatory Visit: Payer: Commercial Managed Care - HMO | Admitting: Physician Assistant

## 2017-05-01 ENCOUNTER — Ambulatory Visit: Payer: 59 | Admitting: Physician Assistant

## 2017-07-06 ENCOUNTER — Encounter: Payer: Self-pay | Admitting: Physician Assistant

## 2018-01-06 ENCOUNTER — Ambulatory Visit (INDEPENDENT_AMBULATORY_CARE_PROVIDER_SITE_OTHER): Payer: 59 | Admitting: Emergency Medicine

## 2018-01-06 ENCOUNTER — Encounter: Payer: Self-pay | Admitting: Emergency Medicine

## 2018-01-06 ENCOUNTER — Other Ambulatory Visit: Payer: Self-pay

## 2018-01-06 VITALS — BP 126/76 | HR 78 | Temp 98.7°F | Resp 16 | Ht 67.5 in | Wt 197.0 lb

## 2018-01-06 DIAGNOSIS — Z0000 Encounter for general adult medical examination without abnormal findings: Secondary | ICD-10-CM | POA: Diagnosis not present

## 2018-01-06 DIAGNOSIS — N529 Male erectile dysfunction, unspecified: Secondary | ICD-10-CM | POA: Diagnosis not present

## 2018-01-06 MED ORDER — SILDENAFIL CITRATE 100 MG PO TABS: 50.0000 mg | ORAL_TABLET | Freq: Every day | ORAL | 11 refills | Status: DC | PRN

## 2018-01-06 NOTE — Progress Notes (Signed)
BP Readings from Last 3 Encounters:  03/13/17 (!) 149/86  03/21/15 (!) 132/96  02/27/15 (!) 136/94   Wt Readings from Last 3 Encounters:  03/21/15 200 lb (90.7 kg)  02/27/15 202 lb (91.6 kg)  12/27/14 198 lb (89.8 kg)   Willie Ferguson 52 y.o.   Chief Complaint  Patient presents with  . Annual Exam    HISTORY OF PRESENT ILLNESS: This is a 52 y.o. male here for his annual exam.  No chronic medical problems.  On no chronic medications. Complaining of problems with erection. No other complaints or medical concerns. Had colonoscopy last year and told that it was all normal. HPI   Prior to Admission medications   Not on File    No Known Allergies  Patient Active Problem List   Diagnosis Date Noted  . Elevated BP 03/03/2015  . Witnessed apneic spells 03/03/2015    Past Medical History:  Diagnosis Date  . Snoring     No past surgical history on file.  Social History   Socioeconomic History  . Marital status: Married    Spouse name: Not on file  . Number of children: Not on file  . Years of education: Not on file  . Highest education level: Not on file  Occupational History    Employer: [employer]  Social Needs  . Financial resource strain: Not on file  . Food insecurity:    Worry: Not on file    Inability: Not on file  . Transportation needs:    Medical: Not on file    Non-medical: Not on file  Tobacco Use  . Smoking status: Never Smoker  . Smokeless tobacco: Never Used  Substance and Sexual Activity  . Alcohol use: No    Alcohol/week: 0.0 standard drinks  . Drug use: No  . Sexual activity: Not on file  Lifestyle  . Physical activity:    Days per week: Not on file    Minutes per session: Not on file  . Stress: Not on file  Relationships  . Social connections:    Talks on phone: Not on file    Gets together: Not on file    Attends religious service: Not on file    Active member of club or organization: Not on file    Attends meetings of  clubs or organizations: Not on file    Relationship status: Not on file  . Intimate partner violence:    Fear of current or ex partner: Not on file    Emotionally abused: Not on file    Physically abused: Not on file    Forced sexual activity: Not on file  Other Topics Concern  . Not on file  Social History Narrative   Drinks 3 16oz sodas daily.    Family History  Problem Relation Age of Onset  . Hypertension Mother   . Hypertension Brother   . Hypertension Brother      Review of Systems  Constitutional: Negative.  Negative for chills, fever and weight loss.  HENT: Negative.  Negative for hearing loss and sore throat.   Eyes: Negative.  Negative for blurred vision and double vision.  Respiratory: Negative.  Negative for cough and shortness of breath.   Cardiovascular: Negative.  Negative for chest pain and palpitations.  Gastrointestinal: Negative.  Negative for abdominal pain, blood in stool, diarrhea, heartburn, melena, nausea and vomiting.  Genitourinary: Negative.  Negative for dysuria and urgency.       Erectile dysfunction  Musculoskeletal:  Negative.  Negative for back pain, myalgias and neck pain.  Skin: Negative.  Negative for rash.  Neurological: Negative.  Negative for dizziness, seizures, loss of consciousness and headaches.  Endo/Heme/Allergies: Negative.   All other systems reviewed and are negative.   Vitals:   01/06/18 1050  BP: 126/76  Pulse: 78  Resp: 16  Temp: 98.7 F (37.1 C)  SpO2: 97%    Physical Exam  Constitutional: He is oriented to person, place, and time. He appears well-developed and well-nourished.  HENT:  Head: Normocephalic and atraumatic.  Right Ear: External ear normal.  Left Ear: External ear normal.  Nose: Nose normal.  Mouth/Throat: Oropharynx is clear and moist.  Eyes: Pupils are equal, round, and reactive to light. Conjunctivae and EOM are normal.  Neck: Normal range of motion. Neck supple. No JVD present. No thyromegaly  present.  Cardiovascular: Normal rate, regular rhythm, normal heart sounds and intact distal pulses.  Pulmonary/Chest: Effort normal and breath sounds normal. No respiratory distress.  Abdominal: Soft. Bowel sounds are normal. He exhibits no distension. There is no tenderness.  Musculoskeletal: Normal range of motion. He exhibits no edema or tenderness.  Lymphadenopathy:    He has no cervical adenopathy.  Neurological: He is alert and oriented to person, place, and time. No sensory deficit. He exhibits normal muscle tone. Coordination normal.  Skin: Skin is warm and dry. Capillary refill takes less than 2 seconds. No rash noted.  Psychiatric: He has a normal mood and affect. His behavior is normal.  Vitals reviewed.    ASSESSMENT & PLAN: [name] was seen today for annual exam.  Diagnoses and all orders for this visit:  Routine general medical examination at a health care facility -     CBC with Differential -     Comprehensive metabolic panel -     Hemoglobin A1c -     Lipid panel  Erectile dysfunction, unspecified erectile dysfunction type -     sildenafil (VIAGRA) 100 MG tablet; Take 0.5-1 tablets (50-100 mg total) by mouth daily as needed for erectile dysfunction.    Patient Instructions    Health Maintenance, Male A healthy lifestyle and preventive care is important for your health and wellness. Ask your health care provider about what schedule of regular examinations is right for you. What should I know about weight and diet? Eat a Healthy Diet  Eat plenty of vegetables, fruits, whole grains, low-fat dairy products, and lean protein.  Do not eat a lot of foods high in solid fats, added sugars, or salt.  Maintain a Healthy Weight Regular exercise can help you achieve or maintain a healthy weight. You should:  Do at least 150 minutes of exercise each week. The exercise should increase your heart rate and make you sweat (moderate-intensity exercise).  Do  strength-training exercises at least twice a week.  Watch Your Levels of Cholesterol and Blood Lipids  Have your blood tested for lipids and cholesterol every 5 years starting at 51 years of age. If you are at high risk for heart disease, you should start having your blood tested when you are 51 years old. You may need to have your cholesterol levels checked more often if: ? Your lipid or cholesterol levels are high. ? You are older than 51 years of age. ? You are at high risk for heart disease.  What should I know about cancer screening? Many types of cancers can be detected early and may often be prevented. Lung Cancer  You should  be screened every year for lung cancer if: ? You are a current smoker who has smoked for at least 30 years. ? You are a former smoker who has quit within the past 15 years.  Talk to your health care provider about your screening options, when you should start screening, and how often you should be screened.  Colorectal Cancer  Routine colorectal cancer screening usually begins at 51 years of age and should be repeated every 5-10 years until you are 51 years old. You may need to be screened more often if early forms of precancerous polyps or small growths are found. Your health care provider may recommend screening at an earlier age if you have risk factors for colon cancer.  Your health care provider may recommend using home test kits to check for hidden blood in the stool.  A small camera at the end of a tube can be used to examine your colon (sigmoidoscopy or colonoscopy). This checks for the earliest forms of colorectal cancer.  Prostate and Testicular Cancer  Depending on your age and overall health, your health care provider may do certain tests to screen for prostate and testicular cancer.  Talk to your health care provider about any symptoms or concerns you have about testicular or prostate cancer.  Skin Cancer  Check your skin from head to toe  regularly.  Tell your health care provider about any new moles or changes in moles, especially if: ? There is a change in a mole's size, shape, or color. ? You have a mole that is larger than a pencil eraser.  Always use sunscreen. Apply sunscreen liberally and repeat throughout the day.  Protect yourself by wearing long sleeves, pants, a wide-brimmed hat, and sunglasses when outside.  What should I know about heart disease, diabetes, and high blood pressure?  If you are 69-52 years of age, have your blood pressure checked every 3-5 years. If you are 16 years of age or older, have your blood pressure checked every year. You should have your blood pressure measured twice-once when you are at a hospital or clinic, and once when you are not at a hospital or clinic. Record the average of the two measurements. To check your blood pressure when you are not at a hospital or clinic, you can use: ? An automated blood pressure machine at a pharmacy. ? A home blood pressure monitor.  Talk to your health care provider about your target blood pressure.  If you are between 66-39 years old, ask your health care provider if you should take aspirin to prevent heart disease.  Have regular diabetes screenings by checking your fasting blood sugar level. ? If you are at a normal weight and have a low risk for diabetes, have this test once every three years after the age of 34. ? If you are overweight and have a high risk for diabetes, consider being tested at a younger age or more often.  A one-time screening for abdominal aortic aneurysm (AAA) by ultrasound is recommended for men aged 65-75 years who are current or former smokers. What should I know about preventing infection? Hepatitis B If you have a higher risk for hepatitis B, you should be screened for this virus. Talk with your health care provider to find out if you are at risk for hepatitis B infection. Hepatitis C Blood testing is recommended  for:  Everyone born from [birth year] through 1964.  Anyone with known risk factors for hepatitis C.  Sexually Transmitted  Diseases (STDs)  You should be screened each year for STDs including gonorrhea and chlamydia if: ? You are sexually active and are younger than 51 years of age. ? You are older than 51 years of age and your health care provider tells you that you are at risk for this type of infection. ? Your sexual activity has changed since you were last screened and you are at an increased risk for chlamydia or gonorrhea. Ask your health care provider if you are at risk.  Talk with your health care provider about whether you are at high risk of being infected with HIV. Your health care provider may recommend a prescription medicine to help prevent HIV infection.  What else can I do?  Schedule regular health, dental, and eye exams.  Stay current with your vaccines (immunizations).  Do not use any tobacco products, such as cigarettes, chewing tobacco, and e-cigarettes. If you need help quitting, ask your health care provider.  Limit alcohol intake to no more than 2 drinks per day. One drink equals 12 ounces of beer, 5 ounces of wine, or 1 ounces of hard liquor.  Do not use street drugs.  Do not share needles.  Ask your health care provider for help if you need support or information about quitting drugs.  Tell your health care provider if you often feel depressed.  Tell your health care provider if you have ever been abused or do not feel safe at home. This information is not intended to replace advice given to you by your health care provider. Make sure you discuss any questions you have with your health care provider. Document Released: 09/20/2007 Document Revised: 11/21/2015 Document Reviewed: 12/26/2014 Elsevier Interactive Patient Education  2018 Elsevier Inc.      Edwina Barth, MD [facility]

## 2018-01-06 NOTE — Patient Instructions (Signed)

## 2018-01-07 LAB — CBC WITH DIFFERENTIAL/PLATELET
Basophils Absolute: 0 10*3/uL (ref 0.0–0.2)
Basos: 0 %
EOS (ABSOLUTE): 0.2 10*3/uL (ref 0.0–0.4)
Eos: 4 %
Hematocrit: 45.7 % (ref 37.5–51.0)
Hemoglobin: 15.5 g/dL (ref 13.0–17.7)
Immature Grans (Abs): 0 10*3/uL (ref 0.0–0.1)
Immature Granulocytes: 0 %
Lymphocytes Absolute: 1.8 10*3/uL (ref 0.7–3.1)
Lymphs: 33 %
MCH: 29 pg (ref 26.6–33.0)
MCHC: 33.9 g/dL (ref 31.5–35.7)
MCV: 86 fL (ref 79–97)
Monocytes Absolute: 0.6 10*3/uL (ref 0.1–0.9)
Monocytes: 11 %
Neutrophils Absolute: 2.9 10*3/uL (ref 1.4–7.0)
Neutrophils: 52 %
Platelets: 215 10*3/uL (ref 150–450)
RBC: 5.34 x10E6/uL (ref 4.14–5.80)
RDW: 13.4 % (ref 12.3–15.4)
WBC: 5.4 10*3/uL (ref 3.4–10.8)

## 2018-01-07 LAB — LIPID PANEL
Chol/HDL Ratio: 4.4 ratio (ref 0.0–5.0)
Cholesterol, Total: 177 mg/dL (ref 100–199)
HDL: 40 mg/dL (ref >39)
LDL Calculated: 104 mg/dL — ABNORMAL HIGH (ref 0–99)
Triglycerides: 164 mg/dL — ABNORMAL HIGH (ref 0–149)
VLDL Cholesterol Cal: 33 mg/dL (ref 5–40)

## 2018-01-07 LAB — COMPREHENSIVE METABOLIC PANEL
ALT: 8 IU/L (ref 0–44)
AST: 11 IU/L (ref 0–40)
Albumin/Globulin Ratio: 1.7 (ref 1.2–2.2)
Albumin: 4.3 g/dL (ref 3.5–5.5)
Alkaline Phosphatase: 41 IU/L (ref 39–117)
BUN/Creatinine Ratio: 8 — ABNORMAL LOW (ref 9–20)
BUN: 9 mg/dL (ref 6–24)
Bilirubin Total: 0.5 mg/dL (ref 0.0–1.2)
CO2: 24 mmol/L (ref 20–29)
Calcium: 9.6 mg/dL (ref 8.7–10.2)
Chloride: 102 mmol/L (ref 96–106)
Creatinine, Ser: 1.11 mg/dL (ref 0.76–1.27)
GFR calc Af Amer: 88 mL/min/{1.73_m2} (ref >59)
GFR calc non Af Amer: 76 mL/min/{1.73_m2} (ref >59)
Globulin, Total: 2.5 g/dL (ref 1.5–4.5)
Glucose: 85 mg/dL (ref 65–99)
Potassium: 4.1 mmol/L (ref 3.5–5.2)
Sodium: 141 mmol/L (ref 134–144)
Total Protein: 6.8 g/dL (ref 6.0–8.5)

## 2018-01-07 LAB — HEMOGLOBIN A1C
Est. average glucose Bld gHb Est-mCnc: 117 mg/dL
Hgb A1c MFr Bld: 5.7 % — ABNORMAL HIGH (ref 4.8–5.6)

## 2019-05-16 ENCOUNTER — Other Ambulatory Visit: Payer: Self-pay | Admitting: Emergency Medicine

## 2019-05-16 DIAGNOSIS — N529 Male erectile dysfunction, unspecified: Secondary | ICD-10-CM

## 2019-05-18 ENCOUNTER — Other Ambulatory Visit: Payer: Self-pay | Admitting: Emergency Medicine

## 2019-05-18 DIAGNOSIS — N529 Male erectile dysfunction, unspecified: Secondary | ICD-10-CM

## 2019-05-18 NOTE — Telephone Encounter (Signed)
Copied from CRM [id number]. Topic: Quick Communication - Rx Refill/Question >> May 18, 2019  9:56 AM Dalphine Handing A wrote: Medication: sildenafil (VIAGRA) 100 MG tablet  Has the patient contacted their pharmacy? Yes (Agent: If no, request that the patient contact the pharmacy for the refill.) (Agent: If yes, when and what did the pharmacy advise?)Contact PCP  Preferred Pharmacy (with phone number or street name): [facility] [address] [place]), [place] - [id number]  Phone:  [phone] Fax:  [phone]     Agent: Please be advised that RX refills may take up to 3 business days. We ask that you follow-up with your pharmacy.

## 2019-05-19 ENCOUNTER — Other Ambulatory Visit: Payer: Self-pay | Admitting: Emergency Medicine

## 2019-05-19 DIAGNOSIS — N529 Male erectile dysfunction, unspecified: Secondary | ICD-10-CM

## 2019-05-25 ENCOUNTER — Other Ambulatory Visit: Payer: Self-pay

## 2019-05-25 ENCOUNTER — Ambulatory Visit: Payer: 59 | Admitting: Emergency Medicine

## 2019-05-25 ENCOUNTER — Encounter: Payer: Self-pay | Admitting: Emergency Medicine

## 2019-05-25 VITALS — BP 154/78 | HR 77 | Temp 97.7°F | Ht 67.5 in | Wt 200.0 lb

## 2019-05-25 DIAGNOSIS — N529 Male erectile dysfunction, unspecified: Secondary | ICD-10-CM

## 2019-05-25 DIAGNOSIS — I10 Essential (primary) hypertension: Secondary | ICD-10-CM | POA: Diagnosis not present

## 2019-05-25 MED ORDER — LOSARTAN POTASSIUM 50 MG PO TABS: 50.0000 mg | ORAL_TABLET | Freq: Every day | ORAL | 3 refills | Status: DC

## 2019-05-25 MED ORDER — SILDENAFIL CITRATE 100 MG PO TABS: 50.0000 mg | ORAL_TABLET | Freq: Every day | ORAL | 11 refills | Status: DC | PRN

## 2019-05-25 NOTE — Progress Notes (Signed)
Willie Ferguson 54 y.o.   Chief Complaint  Patient presents with   Follow-up    HISTORY OF PRESENT ILLNESS: This is a 54 y.o. male here for follow-up and medication refill. 1.  History of transient elevations of blood pressure.  States blood pressure is very high every time he goes to the dentist.  He has been told several times that he may need blood pressure medication.  Does not take blood pressure readings at home. BP Readings from Last 3 Encounters:  05/25/19 (!) 154/78  01/06/18 126/76  03/13/17 (!) 149/86    2.  History of erectile dysfunction.  Needs medication refills. Has no complaints or medical concerns today.  HPI   Prior to Admission medications   Medication Sig Start Date End Date Taking? Authorizing Provider  sildenafil (VIAGRA) 100 MG tablet Take 0.5-1 tablets (50-100 mg total) by mouth daily as needed for erectile dysfunction. 01/06/18   Willie Quint, MD    No Known Allergies  Patient Active Problem List   Diagnosis Date Noted   Elevated BP 03/03/2015   Witnessed apneic spells 03/03/2015    Past Medical History:  Diagnosis Date   Snoring     History reviewed. No pertinent surgical history.  Social History   Socioeconomic History   Marital status: Married    Spouse name: Not on file   Number of children: Not on file   Years of education: Not on file   Highest education level: Not on file  Occupational History    Employer: [employer]  Tobacco Use   Smoking status: Never Smoker   Smokeless tobacco: Never Used  Substance and Sexual Activity   Alcohol use: No    Alcohol/week: 0.0 standard drinks   Drug use: No   Sexual activity: Not on file  Other Topics Concern   Not on file  Social History Narrative   Drinks 3 16oz sodas daily.   Social Determinants of Health   Financial Resource Strain:    Difficulty of Paying Living Expenses: Not on file  Food Insecurity:    Worried About Programme researcher, broadcasting/film/video in the  Last Year: Not on file   [employer] of Food in the Last Year: Not on file  Transportation Needs:    Lack of Transportation (Medical): Not on file   Lack of Transportation (Non-Medical): Not on file  Physical Activity:    Days of Exercise per Week: Not on file   Minutes of Exercise per Session: Not on file  Stress:    Feeling of Stress : Not on file  Social Connections:    Frequency of Communication with Friends and Family: Not on file   Frequency of Social Gatherings with Friends and Family: Not on file   Attends Religious Services: Not on file   Active Member of Clubs or Organizations: Not on file   Attends Banker Meetings: Not on file   Marital Status: Not on file  Intimate Partner Violence:    Fear of Current or Ex-Partner: Not on file   Emotionally Abused: Not on file   Physically Abused: Not on file   Sexually Abused: Not on file    Family History  Problem Relation Age of Onset   Hypertension Mother    Hypertension Brother    Hypertension Brother      Review of Systems  Constitutional: Negative.  Negative for chills and fever.  HENT: Negative.  Negative for congestion and sore throat.   Eyes: Negative.  Respiratory: Negative.  Negative for cough and shortness of breath.   Cardiovascular: Negative.  Negative for chest pain and palpitations.  Gastrointestinal: Negative.  Negative for abdominal pain, diarrhea, nausea and vomiting.  Genitourinary: Negative.  Negative for dysuria and hematuria.       Erectile dysfunction  Musculoskeletal: Negative.  Negative for myalgias and neck pain.  Skin: Negative.  Negative for rash.  Neurological: Negative.  Negative for dizziness and headaches.  Endo/Heme/Allergies: Negative.   All other systems reviewed and are negative.  Vitals:   05/25/19 0942  BP: (!) 176/97  Pulse: 77  Temp: 97.7 F (36.5 C)  SpO2: 100%   Repeat manual blood pressure by me: 150/80  Physical Exam Vitals reviewed.    Constitutional:      Appearance: Normal appearance.  HENT:     Head: Normocephalic.  Eyes:     Extraocular Movements: Extraocular movements intact.     Conjunctiva/sclera: Conjunctivae normal.     Pupils: Pupils are equal, round, and reactive to light.  Cardiovascular:     Rate and Rhythm: Normal rate and regular rhythm.     Pulses: Normal pulses.     Heart sounds: Normal heart sounds.  Pulmonary:     Effort: Pulmonary effort is normal.     Breath sounds: Normal breath sounds.  Abdominal:     General: There is no distension.     Palpations: Abdomen is soft.     Tenderness: There is no abdominal tenderness.  Musculoskeletal:        General: Normal range of motion.     Cervical back: Normal range of motion and neck supple.  Skin:    General: Skin is warm and dry.     Capillary Refill: Capillary refill takes less than 2 seconds.  Neurological:     General: No focal deficit present.     Mental Status: He is alert and oriented to person, place, and time.  Psychiatric:        Mood and Affect: Mood normal.        Behavior: Behavior normal.     A total of 30 minutes was spent with the patient, greater than 50% of which was in counseling/coordination of care regarding health maintenance and hypertension, need to monitor blood pressure readings at home, need to start low-dose antihypertensive, need for blood work, diet and nutrition, prognosis and need for follow-up.  ASSESSMENT & PLAN: Willie Ferguson was seen today for medication refills.  Diagnoses and all orders for this visit:  Essential hypertension -     CBC with Differential/Platelet -     Comprehensive metabolic panel -     Lipid panel -     losartan (COZAAR) 50 MG tablet; Take 1 tablet (50 mg total) by mouth daily.  Erectile dysfunction, unspecified erectile dysfunction type -     sildenafil (VIAGRA) 100 MG tablet; Take 0.5-1 tablets (50-100 mg total) by mouth daily as needed for erectile dysfunction.    Patient  Instructions  Health Maintenance, Male Adopting a healthy lifestyle and getting preventive care are important in promoting health and wellness. Ask your health care provider about:  The right schedule for you to have regular tests and exams.  Things you can do on your own to prevent diseases and keep yourself healthy. What should I know about diet, weight, and exercise? Eat a healthy diet   Eat a diet that includes plenty of vegetables, fruits, low-fat dairy products, and lean protein.  Do not eat a lot of  foods that are high in solid fats, added sugars, or sodium. Maintain a healthy weight Body mass index (BMI) is a measurement that can be used to identify possible weight problems. It estimates body fat based on height and weight. Your health care provider can help determine your BMI and help you achieve or maintain a healthy weight. Get regular exercise Get regular exercise. This is one of the most important things you can do for your health. Most adults should:  Exercise for at least 150 minutes each week. The exercise should increase your heart rate and make you sweat (moderate-intensity exercise).  Do strengthening exercises at least twice a week. This is in addition to the moderate-intensity exercise.  Spend less time sitting. Even light physical activity can be beneficial. Watch cholesterol and blood lipids Have your blood tested for lipids and cholesterol at 53 years of age, then have this test every 5 years. You may need to have your cholesterol levels checked more often if:  Your lipid or cholesterol levels are high.  You are older than 53 years of age.  You are at high risk for heart disease. What should I know about cancer screening? Many types of cancers can be detected early and may often be prevented. Depending on your health history and family history, you may need to have cancer screening at various ages. This may include screening for:  Colorectal  cancer.  Prostate cancer.  Skin cancer.  Lung cancer. What should I know about heart disease, diabetes, and high blood pressure? Blood pressure and heart disease  High blood pressure causes heart disease and increases the risk of stroke. This is more likely to develop in people who have high blood pressure readings, are of African descent, or are overweight.  Talk with your health care provider about your target blood pressure readings.  Have your blood pressure checked: ? Every 3-5 years if you are 34-75 years of age. ? Every year if you are 54 years old or older.  If you are between the ages of 4 and 84 and are a current or former smoker, ask your health care provider if you should have a one-time screening for abdominal aortic aneurysm (AAA). Diabetes Have regular diabetes screenings. This checks your fasting blood sugar level. Have the screening done:  Once every three years after age 73 if you are at a normal weight and have a low risk for diabetes.  More often and at a younger age if you are overweight or have a high risk for diabetes. What should I know about preventing infection? Hepatitis B If you have a higher risk for hepatitis B, you should be screened for this virus. Talk with your health care provider to find out if you are at risk for hepatitis B infection. Hepatitis C Blood testing is recommended for:  Everyone born from [birth year] through 1964.  Anyone with known risk factors for hepatitis C. Sexually transmitted infections (STIs)  You should be screened each year for STIs, including gonorrhea and chlamydia, if: ? You are sexually active and are younger than 53 years of age. ? You are older than 53 years of age and your health care provider tells you that you are at risk for this type of infection. ? Your sexual activity has changed since you were last screened, and you are at increased risk for chlamydia or gonorrhea. Ask your health care provider if you are at  risk.  Ask your health care provider about whether you are  at high risk for HIV. Your health care provider may recommend a prescription medicine to help prevent HIV infection. If you choose to take medicine to prevent HIV, you should first get tested for HIV. You should then be tested every 3 months for as long as you are taking the medicine. Follow these instructions at home: Lifestyle  Do not use any products that contain nicotine or tobacco, such as cigarettes, e-cigarettes, and chewing tobacco. If you need help quitting, ask your health care provider.  Do not use street drugs.  Do not share needles.  Ask your health care provider for help if you need support or information about quitting drugs. Alcohol use  Do not drink alcohol if your health care provider tells you not to drink.  If you drink alcohol: ? Limit how much you have to 0-2 drinks a day. ? Be aware of how much alcohol is in your drink. In the [place] one drink equals one 12 oz bottle of beer (355 mL), one 5 oz glass of wine (148 mL), or one 1 oz glass of hard liquor (44 mL). General instructions  Schedule regular health, dental, and eye exams.  Stay current with your vaccines.  Tell your health care provider if: ? You often feel depressed. ? You have ever been abused or do not feel safe at home. Summary  Adopting a healthy lifestyle and getting preventive care are important in promoting health and wellness.  Follow your health care provider's instructions about healthy diet, exercising, and getting tested or screened for diseases.  Follow your health care provider's instructions on monitoring your cholesterol and blood pressure. This information is not intended to replace advice given to you by your health care provider. Make sure you discuss any questions you have with your health care provider. Document Revised: 03/17/2018 Document Reviewed: 03/17/2018 Elsevier Patient Education  2020 [employer] Hypertension, Adult High blood pressure (hypertension) is when the force of blood pumping through the arteries is too strong. The arteries are the blood vessels that carry blood from the heart throughout the body. Hypertension forces the heart to work harder to pump blood and may cause arteries to become narrow or stiff. Untreated or uncontrolled hypertension can cause a heart attack, heart failure, a stroke, kidney disease, and other problems. A blood pressure reading consists of a higher number over a lower number. Ideally, your blood pressure should be below 120/80. The first ("top") number is called the systolic pressure. It is a measure of the pressure in your arteries as your heart beats. The second ("bottom") number is called the diastolic pressure. It is a measure of the pressure in your arteries as the heart relaxes. What are the causes? The exact cause of this condition is not known. There are some conditions that result in or are related to high blood pressure. What increases the risk? Some risk factors for high blood pressure are under your control. The following factors may make you more likely to develop this condition:  Smoking.  Having type 2 diabetes mellitus, high cholesterol, or both.  Not getting enough exercise or physical activity.  Being overweight.  Having too much fat, sugar, calories, or salt (sodium) in your diet.  Drinking too much alcohol. Some risk factors for high blood pressure may be difficult or impossible to change. Some of these factors include:  Having chronic kidney disease.  Having a family history of high blood pressure.  Age. Risk increases with age.  Race. You may be  at higher risk if you are African American.  Gender. Men are at higher risk than women before age 59. After age 73, women are at higher risk than men.  Having obstructive sleep apnea.  Stress. What are the signs or symptoms? High blood pressure may not cause symptoms.  Very high blood pressure (hypertensive crisis) may cause:  Headache.  Anxiety.  Shortness of breath.  Nosebleed.  Nausea and vomiting.  Vision changes.  Severe chest pain.  Seizures. How is this diagnosed? This condition is diagnosed by measuring your blood pressure while you are seated, with your arm resting on a flat surface, your legs uncrossed, and your feet flat on the floor. The cuff of the blood pressure monitor will be placed directly against the skin of your upper arm at the level of your heart. It should be measured at least twice using the same arm. Certain conditions can cause a difference in blood pressure between your right and left arms. Certain factors can cause blood pressure readings to be lower or higher than normal for a short period of time:  When your blood pressure is higher when you are in a health care provider's office than when you are at home, this is called white coat hypertension. Most people with this condition do not need medicines.  When your blood pressure is higher at home than when you are in a health care provider's office, this is called masked hypertension. Most people with this condition may need medicines to control blood pressure. If you have a high blood pressure reading during one visit or you have normal blood pressure with other risk factors, you may be asked to:  Return on a different day to have your blood pressure checked again.  Monitor your blood pressure at home for 1 week or longer. If you are diagnosed with hypertension, you may have other blood or imaging tests to help your health care provider understand your overall risk for other conditions. How is this treated? This condition is treated by making healthy lifestyle changes, such as eating healthy foods, exercising more, and reducing your alcohol intake. Your health care provider may prescribe medicine if lifestyle changes are not enough to get your blood pressure under control,  and if:  Your systolic blood pressure is above 130.  Your diastolic blood pressure is above 80. Your personal target blood pressure may vary depending on your medical conditions, your age, and other factors. Follow these instructions at home: Eating and drinking   Eat a diet that is high in fiber and potassium, and low in sodium, added sugar, and fat. An example eating plan is called the DASH (Dietary Approaches to Stop Hypertension) diet. To eat this way: ? Eat plenty of fresh fruits and vegetables. Try to fill one half of your plate at each meal with fruits and vegetables. ? Eat whole grains, such as whole-wheat pasta, brown rice, or whole-grain bread. Fill about one fourth of your plate with whole grains. ? Eat or drink low-fat dairy products, such as skim milk or low-fat yogurt. ? Avoid fatty cuts of meat, processed or cured meats, and poultry with skin. Fill about one fourth of your plate with lean proteins, such as fish, chicken without skin, beans, eggs, or tofu. ? Avoid pre-made and processed foods. These tend to be higher in sodium, added sugar, and fat.  Reduce your daily sodium intake. Most people with hypertension should eat less than 1,500 mg of sodium a day.  Do not drink  alcohol if: ? Your health care provider tells you not to drink. ? You are pregnant, may be pregnant, or are planning to become pregnant.  If you drink alcohol: ? Limit how much you use to:  0-1 drink a day for women.  0-2 drinks a day for men. ? Be aware of how much alcohol is in your drink. In the [place] one drink equals one 12 oz bottle of beer (355 mL), one 5 oz glass of wine (148 mL), or one 1 oz glass of hard liquor (44 mL). Lifestyle   Work with your health care provider to maintain a healthy body weight or to lose weight. Ask what an ideal weight is for you.  Get at least 30 minutes of exercise most days of the week. Activities may include walking, swimming, or biking.  Include exercise to  strengthen your muscles (resistance exercise), such as Pilates or lifting weights, as part of your weekly exercise routine. Try to do these types of exercises for 30 minutes at least 3 days a week.  Do not use any products that contain nicotine or tobacco, such as cigarettes, e-cigarettes, and chewing tobacco. If you need help quitting, ask your health care provider.  Monitor your blood pressure at home as told by your health care provider.  Keep all follow-up visits as told by your health care provider. This is important. Medicines  Take over-the-counter and prescription medicines only as told by your health care provider. Follow directions carefully. Blood pressure medicines must be taken as prescribed.  Do not skip doses of blood pressure medicine. Doing this puts you at risk for problems and can make the medicine less effective.  Ask your health care provider about side effects or reactions to medicines that you should watch for. Contact a health care provider if you:  Think you are having a reaction to a medicine you are taking.  Have headaches that keep coming back (recurring).  Feel dizzy.  Have swelling in your ankles.  Have trouble with your vision. Get help right away if you:  Develop a severe headache or confusion.  Have unusual weakness or numbness.  Feel faint.  Have severe pain in your chest or abdomen.  Vomit repeatedly.  Have trouble breathing. Summary  Hypertension is when the force of blood pumping through your arteries is too strong. If this condition is not controlled, it may put you at risk for serious complications.  Your personal target blood pressure may vary depending on your medical conditions, your age, and other factors. For most people, a normal blood pressure is less than 120/80.  Hypertension is treated with lifestyle changes, medicines, or a combination of both. Lifestyle changes include losing weight, eating a healthy, low-sodium diet,  exercising more, and limiting alcohol. This information is not intended to replace advice given to you by your health care provider. Make sure you discuss any questions you have with your health care provider. Document Revised: 12/02/2017 Document Reviewed: 12/02/2017 Elsevier Patient Education  2020 [employer]. How to Take Your Blood Pressure You can take your blood pressure at home with a machine. You may need to check your blood pressure at home:  To check if you have high blood pressure (hypertension).  To check your blood pressure over time.  To make sure your blood pressure medicine is working. Supplies needed: You will need a blood pressure machine, or monitor. You can buy one at a drugstore or online. When choosing one:  Choose one with an  arm cuff.  Choose one that wraps around your upper arm. Only one finger should fit between your arm and the cuff.  Do not choose one that measures your blood pressure from your wrist or finger. Your doctor can suggest a monitor. How to prepare Avoid these things for 30 minutes before checking your blood pressure:  Drinking caffeine.  Drinking alcohol.  Eating.  Smoking.  Exercising. Five minutes before checking your blood pressure:  Pee.  Sit in a dining chair. Avoid sitting in a soft couch or armchair.  Be quiet. Do not talk. How to take your blood pressure Follow the instructions that came with your machine. If you have a digital blood pressure monitor, these may be the instructions: 1. Sit up straight. 2. Place your feet on the floor. Do not cross your ankles or legs. 3. Rest your left arm at the level of your heart. You may rest it on a table, desk, or chair. 4. Pull up your shirt sleeve. 5. Wrap the blood pressure cuff around the upper part of your left arm. The cuff should be 1 inch (2.5 cm) above your elbow. It is best to wrap the cuff around bare skin. 6. Fit the cuff snugly around your arm. You should be able to  place only one finger between the cuff and your arm. 7. Put the cord inside the groove of your elbow. 8. Press the power button. 9. Sit quietly while the cuff fills with air and loses air. 10. Write down the numbers on the screen. 11. Wait 2-3 minutes and then repeat steps 1-10. What do the numbers mean? Two numbers make up your blood pressure. The first number is called systolic pressure. The second is called diastolic pressure. An example of a blood pressure reading is "120 over 80" (or 120/80). If you are an adult and do not have a medical condition, use this guide to find out if your blood pressure is normal: Normal  First number: below 120.  Second number: below 80. Elevated  First number: 120-129.  Second number: below 80. Hypertension stage 1  First number: 130-139.  Second number: 80-89. Hypertension stage 2  First number: 140 or above.  Second number: 90 or above. Your blood pressure is above normal even if only the top or bottom number is above normal. Follow these instructions at home:  Check your blood pressure as often as your doctor tells you to.  Take your monitor to your next doctor's appointment. Your doctor will: ? Make sure you are using it correctly. ? Make sure it is working right.  Make sure you understand what your blood pressure numbers should be.  Tell your doctor if your medicines are causing side effects. Contact a doctor if:  Your blood pressure keeps being high. Get help right away if:  Your first blood pressure number is higher than 180.  Your second blood pressure number is higher than 120. This information is not intended to replace advice given to you by your health care provider. Make sure you discuss any questions you have with your health care provider. Document Revised: 03/06/2017 Document Reviewed: 08/31/2015 Elsevier Patient Education  2020 Else[employer] Inc.      Edwina BarthMiguel Ragena Fiola, MD [facility]

## 2019-05-25 NOTE — Patient Instructions (Addendum)
If you have lab work done today you will be contacted with your lab results within the next 2 weeks.  If you have not heard from [place] then please contact us. The fastest way to get your results is to register for My Chart.   IF you received an x-ray today, you will receive an invoice from [facility]. Please contact [facility] at [phone] with questions or concerns regarding your invoice.   IF you received labwork today, you will receive an invoice from [facility]. Please contact [facility] at [phone] with questions or concerns regarding your invoice.   Our billing staff will not be able to assist you with questions regarding bills from these companies.  You will be contacted with the lab results as soon as they are available. The fastest way to get your results is to activate your My Chart account. Instructions are located on the last page of this paperwork. If you have not heard from [place] regarding the results in 2 weeks, please contact this office.          Health Maintenance, Male Adopting a healthy lifestyle and getting preventive care are important in promoting health and wellness. Ask your health care provider about:  The right schedule for you to have regular tests and exams.  Things you can do on your own to prevent diseases and keep yourself healthy. What should I know about diet, weight, and exercise? Eat a healthy diet   Eat a diet that includes plenty of vegetables, fruits, low-fat dairy products, and lean protein.  Do not eat a lot of foods that are high in solid fats, added sugars, or sodium. Maintain a healthy weight Body mass index (BMI) is a measurement that can be used to identify possible weight problems. It estimates body fat based on height and weight. Your health care provider can help determine your BMI and help you achieve or maintain a healthy weight. Get regular exercise Get regular exercise. This is one of the most important  things you can do for your health. Most adults should:  Exercise for at least 150 minutes each week. The exercise should increase your heart rate and make you sweat (moderate-intensity exercise).  Do strengthening exercises at least twice a week. This is in addition to the moderate-intensity exercise.  Spend less time sitting. Even light physical activity can be beneficial. Watch cholesterol and blood lipids Have your blood tested for lipids and cholesterol at 53 years of age, then have this test every 5 years. You may need to have your cholesterol levels checked more often if:  Your lipid or cholesterol levels are high.  You are older than 53 years of age.  You are at high risk for heart disease. What should I know about cancer screening? Many types of cancers can be detected early and may often be prevented. Depending on your health history and family history, you may need to have cancer screening at various ages. This may include screening for:  Colorectal cancer.  Prostate cancer.  Skin cancer.  Lung cancer. What should I know about heart disease, diabetes, and high blood pressure? Blood pressure and heart disease  High blood pressure causes heart disease and increases the risk of stroke. This is more likely to develop in people who have high blood pressure readings, are of African descent, or are overweight.  Talk with your health care provider about your target blood pressure readings.  Have your blood pressure checked: ? Every 3-5 years  if you are 41-67 years of age. ? Every year if you are 49 years old or older.  If you are between the ages of 78 and 53 and are a current or former smoker, ask your health care provider if you should have a one-time screening for abdominal aortic aneurysm (AAA). Diabetes Have regular diabetes screenings. This checks your fasting blood sugar level. Have the screening done:  Once every three years after age 79 if you are at a normal  weight and have a low risk for diabetes.  More often and at a younger age if you are overweight or have a high risk for diabetes. What should I know about preventing infection? Hepatitis B If you have a higher risk for hepatitis B, you should be screened for this virus. Talk with your health care provider to find out if you are at risk for hepatitis B infection. Hepatitis C Blood testing is recommended for:  Everyone born from [birth year] through 1964.  Anyone with known risk factors for hepatitis C. Sexually transmitted infections (STIs)  You should be screened each year for STIs, including gonorrhea and chlamydia, if: ? You are sexually active and are younger than 53 years of age. ? You are older than 53 years of age and your health care provider tells you that you are at risk for this type of infection. ? Your sexual activity has changed since you were last screened, and you are at increased risk for chlamydia or gonorrhea. Ask your health care provider if you are at risk.  Ask your health care provider about whether you are at high risk for HIV. Your health care provider may recommend a prescription medicine to help prevent HIV infection. If you choose to take medicine to prevent HIV, you should first get tested for HIV. You should then be tested every 3 months for as long as you are taking the medicine. Follow these instructions at home: Lifestyle  Do not use any products that contain nicotine or tobacco, such as cigarettes, e-cigarettes, and chewing tobacco. If you need help quitting, ask your health care provider.  Do not use street drugs.  Do not share needles.  Ask your health care provider for help if you need support or information about quitting drugs. Alcohol use  Do not drink alcohol if your health care provider tells you not to drink.  If you drink alcohol: ? Limit how much you have to 0-2 drinks a day. ? Be aware of how much alcohol is in your drink. In the [place] one  drink equals one 12 oz bottle of beer (355 mL), one 5 oz glass of wine (148 mL), or one 1 oz glass of hard liquor (44 mL). General instructions  Schedule regular health, dental, and eye exams.  Stay current with your vaccines.  Tell your health care provider if: ? You often feel depressed. ? You have ever been abused or do not feel safe at home. Summary  Adopting a healthy lifestyle and getting preventive care are important in promoting health and wellness.  Follow your health care provider's instructions about healthy diet, exercising, and getting tested or screened for diseases.  Follow your health care provider's instructions on monitoring your cholesterol and blood pressure. This information is not intended to replace advice given to you by your health care provider. Make sure you discuss any questions you have with your health care provider. Document Revised: 03/17/2018 Document Reviewed: 03/17/2018 Elsevier Patient Education  [employer]. Hypertension, Adult  High blood pressure (hypertension) is when the force of blood pumping through the arteries is too strong. The arteries are the blood vessels that carry blood from the heart throughout the body. Hypertension forces the heart to work harder to pump blood and may cause arteries to become narrow or stiff. Untreated or uncontrolled hypertension can cause a heart attack, heart failure, a stroke, kidney disease, and other problems. A blood pressure reading consists of a higher number over a lower number. Ideally, your blood pressure should be below 120/80. The first ("top") number is called the systolic pressure. It is a measure of the pressure in your arteries as your heart beats. The second ("bottom") number is called the diastolic pressure. It is a measure of the pressure in your arteries as the heart relaxes. What are the causes? The exact cause of this condition is not known. There are some conditions that result in or are  related to high blood pressure. What increases the risk? Some risk factors for high blood pressure are under your control. The following factors may make you more likely to develop this condition:  Smoking.  Having type 2 diabetes mellitus, high cholesterol, or both.  Not getting enough exercise or physical activity.  Being overweight.  Having too much fat, sugar, calories, or salt (sodium) in your diet.  Drinking too much alcohol. Some risk factors for high blood pressure may be difficult or impossible to change. Some of these factors include:  Having chronic kidney disease.  Having a family history of high blood pressure.  Age. Risk increases with age.  Race. You may be at higher risk if you are African American.  Gender. Men are at higher risk than women before age 745. After age 53, women are at higher risk than men.  Having obstructive sleep apnea.  Stress. What are the signs or symptoms? High blood pressure may not cause symptoms. Very high blood pressure (hypertensive crisis) may cause:  Headache.  Anxiety.  Shortness of breath.  Nosebleed.  Nausea and vomiting.  Vision changes.  Severe chest pain.  Seizures. How is this diagnosed? This condition is diagnosed by measuring your blood pressure while you are seated, with your arm resting on a flat surface, your legs uncrossed, and your feet flat on the floor. The cuff of the blood pressure monitor will be placed directly against the skin of your upper arm at the level of your heart. It should be measured at least twice using the same arm. Certain conditions can cause a difference in blood pressure between your right and left arms. Certain factors can cause blood pressure readings to be lower or higher than normal for a short period of time:  When your blood pressure is higher when you are in a health care provider's office than when you are at home, this is called white coat hypertension. Most people with this  condition do not need medicines.  When your blood pressure is higher at home than when you are in a health care provider's office, this is called masked hypertension. Most people with this condition may need medicines to control blood pressure. If you have a high blood pressure reading during one visit or you have normal blood pressure with other risk factors, you may be asked to:  Return on a different day to have your blood pressure checked again.  Monitor your blood pressure at home for 1 week or longer. If you are diagnosed with hypertension, you may have other blood or imaging  tests to help your health care provider understand your overall risk for other conditions. How is this treated? This condition is treated by making healthy lifestyle changes, such as eating healthy foods, exercising more, and reducing your alcohol intake. Your health care provider may prescribe medicine if lifestyle changes are not enough to get your blood pressure under control, and if:  Your systolic blood pressure is above 130.  Your diastolic blood pressure is above 80. Your personal target blood pressure may vary depending on your medical conditions, your age, and other factors. Follow these instructions at home: Eating and drinking   Eat a diet that is high in fiber and potassium, and low in sodium, added sugar, and fat. An example eating plan is called the DASH (Dietary Approaches to Stop Hypertension) diet. To eat this way: ? Eat plenty of fresh fruits and vegetables. Try to fill one half of your plate at each meal with fruits and vegetables. ? Eat whole grains, such as whole-wheat pasta, brown rice, or whole-grain bread. Fill about one fourth of your plate with whole grains. ? Eat or drink low-fat dairy products, such as skim milk or low-fat yogurt. ? Avoid fatty cuts of meat, processed or cured meats, and poultry with skin. Fill about one fourth of your plate with lean proteins, such as fish, chicken  without skin, beans, eggs, or tofu. ? Avoid pre-made and processed foods. These tend to be higher in sodium, added sugar, and fat.  Reduce your daily sodium intake. Most people with hypertension should eat less than 1,500 mg of sodium a day.  Do not drink alcohol if: ? Your health care provider tells you not to drink. ? You are pregnant, may be pregnant, or are planning to become pregnant.  If you drink alcohol: ? Limit how much you use to:  0-1 drink a day for women.  0-2 drinks a day for men. ? Be aware of how much alcohol is in your drink. In the [place] one drink equals one 12 oz bottle of beer (355 mL), one 5 oz glass of wine (148 mL), or one 1 oz glass of hard liquor (44 mL). Lifestyle   Work with your health care provider to maintain a healthy body weight or to lose weight. Ask what an ideal weight is for you.  Get at least 30 minutes of exercise most days of the week. Activities may include walking, swimming, or biking.  Include exercise to strengthen your muscles (resistance exercise), such as Pilates or lifting weights, as part of your weekly exercise routine. Try to do these types of exercises for 30 minutes at least 3 days a week.  Do not use any products that contain nicotine or tobacco, such as cigarettes, e-cigarettes, and chewing tobacco. If you need help quitting, ask your health care provider.  Monitor your blood pressure at home as told by your health care provider.  Keep all follow-up visits as told by your health care provider. This is important. Medicines  Take over-the-counter and prescription medicines only as told by your health care provider. Follow directions carefully. Blood pressure medicines must be taken as prescribed.  Do not skip doses of blood pressure medicine. Doing this puts you at risk for problems and can make the medicine less effective.  Ask your health care provider about side effects or reactions to medicines that you should watch for.  Contact a health care provider if you:  Think you are having a reaction to a medicine you are  taking.  Have headaches that keep coming back (recurring).  Feel dizzy.  Have swelling in your ankles.  Have trouble with your vision. Get help right away if you:  Develop a severe headache or confusion.  Have unusual weakness or numbness.  Feel faint.  Have severe pain in your chest or abdomen.  Vomit repeatedly.  Have trouble breathing. Summary  Hypertension is when the force of blood pumping through your arteries is too strong. If this condition is not controlled, it may put you at risk for serious complications.  Your personal target blood pressure may vary depending on your medical conditions, your age, and other factors. For most people, a normal blood pressure is less than 120/80.  Hypertension is treated with lifestyle changes, medicines, or a combination of both. Lifestyle changes include losing weight, eating a healthy, low-sodium diet, exercising more, and limiting alcohol. This information is not intended to replace advice given to you by your health care provider. Make sure you discuss any questions you have with your health care provider. Document Revised: 12/02/2017 Document Reviewed: 12/02/2017 Elsevier Patient Education  2020 [employer]. How to Take Your Blood Pressure You can take your blood pressure at home with a machine. You may need to check your blood pressure at home:  To check if you have high blood pressure (hypertension).  To check your blood pressure over time.  To make sure your blood pressure medicine is working. Supplies needed: You will need a blood pressure machine, or monitor. You can buy one at a drugstore or online. When choosing one:  Choose one with an arm cuff.  Choose one that wraps around your upper arm. Only one finger should fit between your arm and the cuff.  Do not choose one that measures your blood pressure from your wrist or  finger. Your doctor can suggest a monitor. How to prepare Avoid these things for 30 minutes before checking your blood pressure:  Drinking caffeine.  Drinking alcohol.  Eating.  Smoking.  Exercising. Five minutes before checking your blood pressure:  Pee.  Sit in a dining chair. Avoid sitting in a soft couch or armchair.  Be quiet. Do not talk. How to take your blood pressure Follow the instructions that came with your machine. If you have a digital blood pressure monitor, these may be the instructions: 1. Sit up straight. 2. Place your feet on the floor. Do not cross your ankles or legs. 3. Rest your left arm at the level of your heart. You may rest it on a table, desk, or chair. 4. Pull up your shirt sleeve. 5. Wrap the blood pressure cuff around the upper part of your left arm. The cuff should be 1 inch (2.5 cm) above your elbow. It is best to wrap the cuff around bare skin. 6. Fit the cuff snugly around your arm. You should be able to place only one finger between the cuff and your arm. 7. Put the cord inside the groove of your elbow. 8. Press the power button. 9. Sit quietly while the cuff fills with air and loses air. 10. Write down the numbers on the screen. 11. Wait 2-3 minutes and then repeat steps 1-10. What do the numbers mean? Two numbers make up your blood pressure. The first number is called systolic pressure. The second is called diastolic pressure. An example of a blood pressure reading is "120 over 80" (or 120/80). If you are an adult and do not have a medical condition, use this  guide to find out if your blood pressure is normal: Normal  First number: below 120.  Second number: below 80. Elevated  First number: 120-129.  Second number: below 80. Hypertension stage 1  First number: 130-139.  Second number: 80-89. Hypertension stage 2  First number: 140 or above.  Second number: 90 or above. Your blood pressure is above normal even if only the  top or bottom number is above normal. Follow these instructions at home:  Check your blood pressure as often as your doctor tells you to.  Take your monitor to your next doctor's appointment. Your doctor will: ? Make sure you are using it correctly. ? Make sure it is working right.  Make sure you understand what your blood pressure numbers should be.  Tell your doctor if your medicines are causing side effects. Contact a doctor if:  Your blood pressure keeps being high. Get help right away if:  Your first blood pressure number is higher than 180.  Your second blood pressure number is higher than 120. This information is not intended to replace advice given to you by your health care provider. Make sure you discuss any questions you have with your health care provider. Document Revised: 03/06/2017 Document Reviewed: 08/31/2015 Elsevier Patient Education  2020 [employer].

## 2019-05-26 LAB — CBC WITH DIFFERENTIAL/PLATELET
Basophils Absolute: 0 10*3/uL (ref 0.0–0.2)
Basos: 0 %
EOS (ABSOLUTE): 0.4 10*3/uL (ref 0.0–0.4)
Eos: 7 %
Hematocrit: 45.9 % (ref 37.5–51.0)
Hemoglobin: 15.8 g/dL (ref 13.0–17.7)
Immature Grans (Abs): 0 10*3/uL (ref 0.0–0.1)
Immature Granulocytes: 0 %
Lymphocytes Absolute: 1.7 10*3/uL (ref 0.7–3.1)
Lymphs: 30 %
MCH: 28.6 pg (ref 26.6–33.0)
MCHC: 34.4 g/dL (ref 31.5–35.7)
MCV: 83 fL (ref 79–97)
Monocytes Absolute: 0.5 10*3/uL (ref 0.1–0.9)
Monocytes: 9 %
Neutrophils Absolute: 3.1 10*3/uL (ref 1.4–7.0)
Neutrophils: 54 %
Platelets: 232 10*3/uL (ref 150–450)
RBC: 5.53 x10E6/uL (ref 4.14–5.80)
RDW: 13.8 % (ref 11.6–15.4)
WBC: 5.8 10*3/uL (ref 3.4–10.8)

## 2019-05-26 LAB — COMPREHENSIVE METABOLIC PANEL
ALT: 12 IU/L (ref 0–44)
AST: 16 IU/L (ref 0–40)
Albumin/Globulin Ratio: 1.6 (ref 1.2–2.2)
Albumin: 4.2 g/dL (ref 3.8–4.9)
Alkaline Phosphatase: 46 IU/L (ref 39–117)
BUN/Creatinine Ratio: 5 — ABNORMAL LOW (ref 9–20)
BUN: 5 mg/dL — ABNORMAL LOW (ref 6–24)
Bilirubin Total: 0.5 mg/dL (ref 0.0–1.2)
CO2: 24 mmol/L (ref 20–29)
Calcium: 9.4 mg/dL (ref 8.7–10.2)
Chloride: 103 mmol/L (ref 96–106)
Creatinine, Ser: 1.04 mg/dL (ref 0.76–1.27)
GFR calc Af Amer: 95 mL/min/{1.73_m2} (ref >59)
GFR calc non Af Amer: 82 mL/min/{1.73_m2} (ref >59)
Globulin, Total: 2.6 g/dL (ref 1.5–4.5)
Glucose: 93 mg/dL (ref 65–99)
Potassium: 4.2 mmol/L (ref 3.5–5.2)
Sodium: 139 mmol/L (ref 134–144)
Total Protein: 6.8 g/dL (ref 6.0–8.5)

## 2019-05-26 LAB — LIPID PANEL
Chol/HDL Ratio: 4.7 ratio (ref 0.0–5.0)
Cholesterol, Total: 166 mg/dL (ref 100–199)
HDL: 35 mg/dL — ABNORMAL LOW (ref >39)
LDL Chol Calc (NIH): 107 mg/dL — ABNORMAL HIGH (ref 0–99)
Triglycerides: 131 mg/dL (ref 0–149)
VLDL Cholesterol Cal: 24 mg/dL (ref 5–40)

## 2019-08-31 ENCOUNTER — Other Ambulatory Visit: Payer: Self-pay

## 2019-08-31 ENCOUNTER — Ambulatory Visit: Payer: 59 | Admitting: Internal Medicine

## 2019-08-31 ENCOUNTER — Encounter: Payer: Self-pay | Admitting: Internal Medicine

## 2019-08-31 VITALS — BP 120/80 | HR 62 | Temp 97.2°F | Ht 66.0 in | Wt 193.9 lb

## 2019-08-31 DIAGNOSIS — Z23 Encounter for immunization: Secondary | ICD-10-CM | POA: Diagnosis not present

## 2019-08-31 DIAGNOSIS — N529 Male erectile dysfunction, unspecified: Secondary | ICD-10-CM | POA: Diagnosis not present

## 2019-08-31 DIAGNOSIS — I10 Essential (primary) hypertension: Secondary | ICD-10-CM

## 2019-08-31 NOTE — Patient Instructions (Signed)
-  Nice seeing you today!!  -First shingles vaccine today.  -Schedule visit for physical in 3-4 months. Please come in fasting that day.

## 2019-08-31 NOTE — Progress Notes (Signed)
New Patient Office Visit     This visit occurred during the SARS-CoV-2 public health emergency.  Safety protocols were in place, including screening questions prior to the visit, additional usage of staff PPE, and extensive cleaning of exam room while observing appropriate contact time as indicated for disinfecting solutions.    CC/Reason for Visit: Establish care, discuss chronic medical conditions Previous PCP: Agustina Caroli, MD Last Visit: February 2021  HPI: Willie Ferguson is a 54 y.o. male who is coming in today for the above mentioned reasons. Past Medical History is significant for: Hypertension well-controlled on 50 mg of losartan and erectile dysfunction on as needed Viagra.  He has no acute complaints today.  He completed his Covid vaccination series in February.  He had a colonoscopy in 2017 and is a 5-year callback.  He does not require medication refills today.  He is married, has 2 grown children he works for [employer] as a Administrator.  He has no known drug allergies, no prior surgical history, he does not smoke, he drinks alcohol occasionally.  His family history is only significant for mother with hypertension.   Past Medical/Surgical History: Past Medical History:  Diagnosis Date  . Snoring     No past surgical history on file.  Social History:  reports that he has never smoked. He has never used smokeless tobacco. He reports that he does not drink alcohol or use drugs.  Allergies: No Known Allergies  Family History:  Family History  Problem Relation Age of Onset  . Hypertension Mother   . Hypertension Brother   . Hypertension Brother      Current Outpatient Medications:  .  losartan (COZAAR) 50 MG tablet, Take 1 tablet (50 mg total) by mouth daily., Disp: 90 tablet, Rfl: 3 .  sildenafil (VIAGRA) 100 MG tablet, Take 0.5-1 tablets (50-100 mg total) by mouth daily as needed for erectile dysfunction., Disp: 10 tablet, Rfl: 11  Review of  Systems:  Constitutional: Denies fever, chills, diaphoresis, appetite change and fatigue.  HEENT: Denies photophobia, eye pain, redness, hearing loss, ear pain, congestion, sore throat, rhinorrhea, sneezing, mouth sores, trouble swallowing, neck pain, neck stiffness and tinnitus.   Respiratory: Denies SOB, DOE, cough, chest tightness,  and wheezing.   Cardiovascular: Denies chest pain, palpitations and leg swelling.  Gastrointestinal: Denies nausea, vomiting, abdominal pain, diarrhea, constipation, blood in stool and abdominal distention.  Genitourinary: Denies dysuria, urgency, frequency, hematuria, flank pain and difficulty urinating.  Endocrine: Denies: hot or cold intolerance, sweats, changes in hair or nails, polyuria, polydipsia. Musculoskeletal: Denies myalgias, back pain, joint swelling, arthralgias and gait problem.  Skin: Denies pallor, rash and wound.  Neurological: Denies dizziness, seizures, syncope, weakness, light-headedness, numbness and headaches.  Hematological: Denies adenopathy. Easy bruising, personal or family bleeding history  Psychiatric/Behavioral: Denies suicidal ideation, mood changes, confusion, nervousness, sleep disturbance and agitation    Physical Exam: Vitals:   08/31/19 0755  BP: 120/80  Pulse: 62  Temp: (!) 97.2 F (36.2 C)  TempSrc: Temporal  SpO2: 97%  Weight: 193 lb 14.4 oz (88 kg)  Height: 5\' 6"  (1.676 m)   Body mass index is 31.3 kg/m.  Constitutional: NAD, calm, comfortable Eyes: PERRL, lids and conjunctivae normal, wears corrective lenses ENMT: Mucous membranes are moist. Respiratory: clear to auscultation bilaterally, no wheezing, no crackles. Normal respiratory effort. No accessory muscle use.  Cardiovascular: Regular rate and rhythm, no murmurs / rubs / gallops. No extremity edema.  Neurologic: Grossly intact and  nonfocal Psychiatric: Normal judgment and insight. Alert and oriented x 3. Normal mood.    Impression and  Plan:  Essential hypertension -Well-controlled on losartan 50 mg.  Erectile dysfunction, unspecified erectile dysfunction type -As needed sildenafil.  First shingles vaccine today.    Patient Instructions  -Nice seeing you today!!  -First shingles vaccine today.  -Schedule visit for physical in 3-4 months. Please come in fasting that day.     Chaya Jan, MD [facility]

## 2019-08-31 NOTE — Addendum Note (Signed)
Addended by: Kern Reap B on: 08/31/2019 12:01 PM   Modules accepted: Orders

## 2019-11-23 ENCOUNTER — Ambulatory Visit: Payer: 59 | Admitting: Emergency Medicine

## 2019-11-24 ENCOUNTER — Encounter: Payer: Self-pay | Admitting: Emergency Medicine

## 2019-12-21 ENCOUNTER — Ambulatory Visit (INDEPENDENT_AMBULATORY_CARE_PROVIDER_SITE_OTHER): Payer: 59 | Admitting: Internal Medicine

## 2019-12-21 ENCOUNTER — Other Ambulatory Visit: Payer: Self-pay

## 2019-12-21 ENCOUNTER — Encounter: Payer: Self-pay | Admitting: Internal Medicine

## 2019-12-21 VITALS — BP 140/90 | HR 74 | Temp 98.4°F | Ht 68.0 in | Wt 194.8 lb

## 2019-12-21 DIAGNOSIS — N529 Male erectile dysfunction, unspecified: Secondary | ICD-10-CM

## 2019-12-21 DIAGNOSIS — Z0000 Encounter for general adult medical examination without abnormal findings: Secondary | ICD-10-CM

## 2019-12-21 DIAGNOSIS — Z23 Encounter for immunization: Secondary | ICD-10-CM | POA: Diagnosis not present

## 2019-12-21 DIAGNOSIS — I10 Essential (primary) hypertension: Secondary | ICD-10-CM | POA: Diagnosis not present

## 2019-12-21 MED ORDER — LOSARTAN POTASSIUM 100 MG PO TABS: 100.0000 mg | ORAL_TABLET | Freq: Every day | ORAL | 1 refills | Status: DC

## 2019-12-21 NOTE — Addendum Note (Signed)
Addended by: Kern Reap B on: 12/21/2019 05:30 PM   Modules accepted: Orders

## 2019-12-21 NOTE — Progress Notes (Signed)
Established Patient Office Visit     This visit occurred during the SARS-CoV-2 public health emergency.  Safety protocols were in place, including screening questions prior to the visit, additional usage of staff PPE, and extensive cleaning of exam room while observing appropriate contact time as indicated for disinfecting solutions.    CC/Reason for Visit: Annual preventive exam  HPI: Willie Ferguson is a 54 y.o. male who is coming in today for the above mentioned reasons. Past Medical History is significant for: Hypertension, erectile dysfunction on as needed Viagra.  He has no acute complaints today.  He is due for flu and second shingles vaccines.  He had a colonoscopy in 2017.  He has routine eye and dental care.  He walks 2-3 times a week.  He has noticed elevated blood pressures at home with an average systolic blood pressure around 921 and diastolic around 80.   Past Medical/Surgical History: Past Medical History:  Diagnosis Date  . Snoring     No past surgical history on file.  Social History:  reports that he has never smoked. He has never used smokeless tobacco. He reports that he does not drink alcohol and does not use drugs.  Allergies: No Known Allergies  Family History:  Family History  Problem Relation Age of Onset  . Hypertension Mother   . Hypertension Brother   . Hypertension Brother      Current Outpatient Medications:  .  losartan (COZAAR) 100 MG tablet, Take 1 tablet (100 mg total) by mouth daily., Disp: 90 tablet, Rfl: 1 .  sildenafil (VIAGRA) 100 MG tablet, Take 0.5-1 tablets (50-100 mg total) by mouth daily as needed for erectile dysfunction., Disp: 10 tablet, Rfl: 11  Review of Systems:  Constitutional: Denies fever, chills, diaphoresis, appetite change and fatigue.  HEENT: Denies photophobia, eye pain, redness, hearing loss, ear pain, congestion, sore throat, rhinorrhea, sneezing, mouth sores, trouble swallowing, neck pain, neck  stiffness and tinnitus.   Respiratory: Denies SOB, DOE, cough, chest tightness,  and wheezing.   Cardiovascular: Denies chest pain, palpitations and leg swelling.  Gastrointestinal: Denies nausea, vomiting, abdominal pain, diarrhea, constipation, blood in stool and abdominal distention.  Genitourinary: Denies dysuria, urgency, frequency, hematuria, flank pain and difficulty urinating.  Endocrine: Denies: hot or cold intolerance, sweats, changes in hair or nails, polyuria, polydipsia. Musculoskeletal: Denies myalgias, back pain, joint swelling, arthralgias and gait problem.  Skin: Denies pallor, rash and wound.  Neurological: Denies dizziness, seizures, syncope, weakness, light-headedness, numbness and headaches.  Hematological: Denies adenopathy. Easy bruising, personal or family bleeding history  Psychiatric/Behavioral: Denies suicidal ideation, mood changes, confusion, nervousness, sleep disturbance and agitation    Physical Exam: Vitals:   12/21/19 0749  BP: 140/90  Pulse: 74  Temp: 98.4 F (36.9 C)  TempSrc: Oral  SpO2: 96%  Weight: 194 lb 12.8 oz (88.4 kg)  Height: _0  (1.727 m)    Body mass index is 29.62 kg/m.   Constitutional: NAD, calm, comfortable Eyes: PERRL, lids and conjunctivae normal, wears corrective lenses ENMT: Mucous membranes are moist.  Tympanic membrane is pearly white, no erythema or bulging. Neck: normal, supple, no masses, no thyromegaly Respiratory: clear to auscultation bilaterally, no wheezing, no crackles. Normal respiratory effort. No accessory muscle use.  Cardiovascular: Regular rate and rhythm, no murmurs / rubs / gallops. No extremity edema. 2+ pedal pulses. No carotid bruits.  Abdomen: no tenderness, no masses palpated. No hepatosplenomegaly. Bowel sounds positive.  Musculoskeletal: no clubbing / cyanosis. No joint deformity  upper and lower extremities. Good ROM, no contractures. Normal muscle tone.  Skin: no rashes, lesions, ulcers. No  induration Neurologic: CN 2-12 grossly intact. Sensation intact, DTR normal. Strength 5/5 in all 4.  Psychiatric: Normal judgment and insight. Alert and oriented x 3. Normal mood.    Impression and Plan:  Encounter for preventive health examination  -He has routine eye and dental care. -Due for second shingles and influenza vaccine today, otherwise immunizations are up-to-date. -Screening labs today. -Healthy lifestyle discussed in detail. -PSA for prostate cancer screening. -He had a colonoscopy in 2017 and is a 5-year callback, due in 2022.  Erectile dysfunction, unspecified erectile dysfunction type -Continue as needed sildenafil.  Essential hypertension -Blood pressure is above goal both in office and at home. -I will increase losartan from 50 to 100 mg daily. -He will do ambulatory blood pressure monitoring and return in 8 weeks for follow-up.  Need for influenza vaccination -Flu vaccine administered today.  Need for shingles vaccine -Second shingles vaccine administered today.    Patient Instructions  -Nice seeing you today!!  -Lab work today; will notify you once results are available.  -Flu and shingles vaccines today.  -Increase losartan to 100 mg daily.  -Schedule follow up in 8 weeks.   Preventive Care 73-4 Years Old, Male Preventive care refers to lifestyle choices and visits with your health care provider that can promote health and wellness. This includes:  A yearly physical exam. This is also called an annual well check.  Regular dental and eye exams.  Immunizations.  Screening for certain conditions.  Healthy lifestyle choices, such as eating a healthy diet, getting regular exercise, not using drugs or products that contain nicotine and tobacco, and limiting alcohol use. What can I expect for my preventive care visit? Physical exam Your health care provider will check:  Height and weight. These may be used to calculate body mass index (BMI),  which is a measurement that tells if you are at a healthy weight.  Heart rate and blood pressure.  Your skin for abnormal spots. Counseling Your health care provider may ask you questions about:  Alcohol, tobacco, and drug use.  Emotional well-being.  Home and relationship well-being.  Sexual activity.  Eating habits.  Work and work Statistician. What immunizations do I need?  Influenza (flu) vaccine  This is recommended every year. Tetanus, diphtheria, and pertussis (Tdap) vaccine  You may need a Td booster every 10 years. Varicella (chickenpox) vaccine  You may need this vaccine if you have not already been vaccinated. Zoster (shingles) vaccine  You may need this after age 19. Measles, mumps, and rubella (MMR) vaccine  You may need at least one dose of MMR if you were born in 1957 or later. You may also need a second dose. Pneumococcal conjugate (PCV13) vaccine  You may need this if you have certain conditions and were not previously vaccinated. Pneumococcal polysaccharide (PPSV23) vaccine  You may need one or two doses if you smoke cigarettes or if you have certain conditions. Meningococcal conjugate (MenACWY) vaccine  You may need this if you have certain conditions. Hepatitis A vaccine  You may need this if you have certain conditions or if you travel or work in places where you may be exposed to hepatitis A. Hepatitis B vaccine  You may need this if you have certain conditions or if you travel or work in places where you may be exposed to hepatitis B. Haemophilus influenzae type b (Hib) vaccine  You may  need this if you have certain risk factors. Human papillomavirus (HPV) vaccine  If recommended by your health care provider, you may need three doses over 6 months. You may receive vaccines as individual doses or as more than one vaccine together in one shot (combination vaccines). Talk with your health care provider about the risks and benefits of  combination vaccines. What tests do I need? Blood tests  Lipid and cholesterol levels. These may be checked every 5 years, or more frequently if you are over 29 years old.  Hepatitis C test.  Hepatitis B test. Screening  Lung cancer screening. You may have this screening every year starting at age 53 if you have a 30-pack-year history of smoking and currently smoke or have quit within the past 15 years.  Prostate cancer screening. Recommendations will vary depending on your family history and other risks.  Colorectal cancer screening. All adults should have this screening starting at age 29 and continuing until age 67. Your health care provider may recommend screening at age 50 if you are at increased risk. You will have tests every 1-10 years, depending on your results and the type of screening test.  Diabetes screening. This is done by checking your blood sugar (glucose) after you have not eaten for a while (fasting). You may have this done every 1-3 years.  Sexually transmitted disease (STD) testing. Follow these instructions at home: Eating and drinking  Eat a diet that includes fresh fruits and vegetables, whole grains, lean protein, and low-fat dairy products.  Take vitamin and mineral supplements as recommended by your health care provider.  Do not drink alcohol if your health care provider tells you not to drink.  If you drink alcohol: ? Limit how much you have to 0-2 drinks a day. ? Be aware of how much alcohol is in your drink. In the [place] one drink equals one 12 oz bottle of beer (355 mL), one 5 oz glass of wine (148 mL), or one 1 oz glass of hard liquor (44 mL). Lifestyle  Take daily care of your teeth and gums.  Stay active. Exercise for at least 30 minutes on 5 or more days each week.  Do not use any products that contain nicotine or tobacco, such as cigarettes, e-cigarettes, and chewing tobacco. If you need help quitting, ask your health care provider.  If you  are sexually active, practice safe sex. Use a condom or other form of protection to prevent STIs (sexually transmitted infections).  Talk with your health care provider about taking a low-dose aspirin every day starting at age 97. What's next?  Go to your health care provider once a year for a well check visit.  Ask your health care provider how often you should have your eyes and teeth checked.  Stay up to date on all vaccines. This information is not intended to replace advice given to you by your health care provider. Make sure you discuss any questions you have with your health care provider. Document Revised: 03/18/2018 Document Reviewed: 03/18/2018 Elsevier Patient Education  2020 [place], [place] [facility]

## 2019-12-21 NOTE — Patient Instructions (Signed)
-Nice seeing you today!!  -Lab work today; will notify you once results are available.  -Flu and shingles vaccines today.  -Increase losartan to 100 mg daily.  -Schedule follow up in 8 weeks.   Preventive Care 18-53 Years Old, Male Preventive care refers to lifestyle choices and visits with your health care provider that can promote health and wellness. This includes:  A yearly physical exam. This is also called an annual well check.  Regular dental and eye exams.  Immunizations.  Screening for certain conditions.  Healthy lifestyle choices, such as eating a healthy diet, getting regular exercise, not using drugs or products that contain nicotine and tobacco, and limiting alcohol use. What can I expect for my preventive care visit? Physical exam Your health care provider will check:  Height and weight. These may be used to calculate body mass index (BMI), which is a measurement that tells if you are at a healthy weight.  Heart rate and blood pressure.  Your skin for abnormal spots. Counseling Your health care provider may ask you questions about:  Alcohol, tobacco, and drug use.  Emotional well-being.  Home and relationship well-being.  Sexual activity.  Eating habits.  Work and work Statistician. What immunizations do I need?  Influenza (flu) vaccine  This is recommended every year. Tetanus, diphtheria, and pertussis (Tdap) vaccine  You may need a Td booster every 10 years. Varicella (chickenpox) vaccine  You may need this vaccine if you have not already been vaccinated. Zoster (shingles) vaccine  You may need this after age 26. Measles, mumps, and rubella (MMR) vaccine  You may need at least one dose of MMR if you were born in 1957 or later. You may also need a second dose. Pneumococcal conjugate (PCV13) vaccine  You may need this if you have certain conditions and were not previously vaccinated. Pneumococcal polysaccharide (PPSV23) vaccine  You  may need one or two doses if you smoke cigarettes or if you have certain conditions. Meningococcal conjugate (MenACWY) vaccine  You may need this if you have certain conditions. Hepatitis A vaccine  You may need this if you have certain conditions or if you travel or work in places where you may be exposed to hepatitis A. Hepatitis B vaccine  You may need this if you have certain conditions or if you travel or work in places where you may be exposed to hepatitis B. Haemophilus influenzae type b (Hib) vaccine  You may need this if you have certain risk factors. Human papillomavirus (HPV) vaccine  If recommended by your health care provider, you may need three doses over 6 months. You may receive vaccines as individual doses or as more than one vaccine together in one shot (combination vaccines). Talk with your health care provider about the risks and benefits of combination vaccines. What tests do I need? Blood tests  Lipid and cholesterol levels. These may be checked every 5 years, or more frequently if you are over 59 years old.  Hepatitis C test.  Hepatitis B test. Screening  Lung cancer screening. You may have this screening every year starting at age 22 if you have a 30-pack-year history of smoking and currently smoke or have quit within the past 15 years.  Prostate cancer screening. Recommendations will vary depending on your family history and other risks.  Colorectal cancer screening. All adults should have this screening starting at age 67 and continuing until age 24. Your health care provider may recommend screening at age 64 if you are  at increased risk. You will have tests every 1-10 years, depending on your results and the type of screening test.  Diabetes screening. This is done by checking your blood sugar (glucose) after you have not eaten for a while (fasting). You may have this done every 1-3 years.  Sexually transmitted disease (STD) testing. Follow these  instructions at home: Eating and drinking  Eat a diet that includes fresh fruits and vegetables, whole grains, lean protein, and low-fat dairy products.  Take vitamin and mineral supplements as recommended by your health care provider.  Do not drink alcohol if your health care provider tells you not to drink.  If you drink alcohol: ? Limit how much you have to 0-2 drinks a day. ? Be aware of how much alcohol is in your drink. In the [place] one drink equals one 12 oz bottle of beer (355 mL), one 5 oz glass of wine (148 mL), or one 1 oz glass of hard liquor (44 mL). Lifestyle  Take daily care of your teeth and gums.  Stay active. Exercise for at least 30 minutes on 5 or more days each week.  Do not use any products that contain nicotine or tobacco, such as cigarettes, e-cigarettes, and chewing tobacco. If you need help quitting, ask your health care provider.  If you are sexually active, practice safe sex. Use a condom or other form of protection to prevent STIs (sexually transmitted infections).  Talk with your health care provider about taking a low-dose aspirin every day starting at age 18. What's next?  Go to your health care provider once a year for a well check visit.  Ask your health care provider how often you should have your eyes and teeth checked.  Stay up to date on all vaccines. This information is not intended to replace advice given to you by your health care provider. Make sure you discuss any questions you have with your health care provider. Document Revised: 03/18/2018 Document Reviewed: 03/18/2018 Elsevier Patient Education  2020 [employer].

## 2019-12-22 ENCOUNTER — Other Ambulatory Visit: Payer: Self-pay | Admitting: Internal Medicine

## 2019-12-22 ENCOUNTER — Encounter: Payer: Self-pay | Admitting: Internal Medicine

## 2019-12-22 DIAGNOSIS — R7302 Impaired glucose tolerance (oral): Secondary | ICD-10-CM | POA: Insufficient documentation

## 2019-12-22 DIAGNOSIS — E559 Vitamin D deficiency, unspecified: Secondary | ICD-10-CM | POA: Insufficient documentation

## 2019-12-22 LAB — CBC WITH DIFFERENTIAL/PLATELET
Absolute Monocytes: 466 cells/uL (ref 200–950)
Basophils Absolute: 12 cells/uL (ref 0–200)
Basophils Relative: 0.2 %
Eosinophils Absolute: 159 cells/uL (ref 15–500)
Eosinophils Relative: 2.7 %
HCT: 47.4 % (ref 38.5–50.0)
Hemoglobin: 15.7 g/dL (ref 13.2–17.1)
Lymphs Abs: 1823 cells/uL (ref 850–3900)
MCH: 28.4 pg (ref 27.0–33.0)
MCHC: 33.1 g/dL (ref 32.0–36.0)
MCV: 85.7 fL (ref 80.0–100.0)
MPV: 11.1 fL (ref 7.5–12.5)
Monocytes Relative: 7.9 %
Neutro Abs: 3440 cells/uL (ref 1500–7800)
Neutrophils Relative %: 58.3 %
Platelets: 204 10*3/uL (ref 140–400)
RBC: 5.53 10*6/uL (ref 4.20–5.80)
RDW: 13.5 % (ref 11.0–15.0)
Total Lymphocyte: 30.9 %
WBC: 5.9 10*3/uL (ref 3.8–10.8)

## 2019-12-22 LAB — COMPREHENSIVE METABOLIC PANEL
AG Ratio: 1.9 (calc) (ref 1.0–2.5)
ALT: 9 U/L (ref 9–46)
AST: 12 U/L (ref 10–35)
Albumin: 4.5 g/dL (ref 3.6–5.1)
Alkaline phosphatase (APISO): 41 U/L (ref 35–144)
BUN: 8 mg/dL (ref 7–25)
CO2: 27 mmol/L (ref 20–32)
Calcium: 9.3 mg/dL (ref 8.6–10.3)
Chloride: 104 mmol/L (ref 98–110)
Creat: 1.07 mg/dL (ref 0.70–1.33)
Globulin: 2.4 g/dL (calc) (ref 1.9–3.7)
Glucose, Bld: 96 mg/dL (ref 65–99)
Potassium: 3.9 mmol/L (ref 3.5–5.3)
Sodium: 139 mmol/L (ref 135–146)
Total Bilirubin: 0.8 mg/dL (ref 0.2–1.2)
Total Protein: 6.9 g/dL (ref 6.1–8.1)

## 2019-12-22 LAB — LIPID PANEL W/REFLEX DIRECT LDL
Cholesterol: 182 mg/dL (ref <200)
HDL: 42 mg/dL (ref >=40)
LDL Cholesterol (Calc): 116 mg/dL (calc) — ABNORMAL HIGH
Non-HDL Cholesterol (Calc): 140 mg/dL (calc) — ABNORMAL HIGH (ref <130)
Total CHOL/HDL Ratio: 4.3 (calc) (ref <5.0)
Triglycerides: 127 mg/dL (ref <150)

## 2019-12-22 LAB — PSA: PSA: 0.32 ng/mL (ref <=4.0)

## 2019-12-22 LAB — HEMOGLOBIN A1C
Hgb A1c MFr Bld: 5.7 % of total Hgb — ABNORMAL HIGH (ref <5.7)
Mean Plasma Glucose: 117 (calc)
eAG (mmol/L): 6.5 (calc)

## 2019-12-22 LAB — TSH: TSH: 1.44 mIU/L (ref 0.40–4.50)

## 2019-12-22 LAB — VITAMIN B12: Vitamin B-12: 322 pg/mL (ref 200–1100)

## 2019-12-22 LAB — VITAMIN D 25 HYDROXY (VIT D DEFICIENCY, FRACTURES): Vit D, 25-Hydroxy: 13 ng/mL — ABNORMAL LOW (ref 30–100)

## 2019-12-22 MED ORDER — VITAMIN D (ERGOCALCIFEROL) 1.25 MG (50000 UNIT) PO CAPS: 50000.0000 [IU] | ORAL_CAPSULE | ORAL | 0 refills | Status: AC

## 2020-01-24 ENCOUNTER — Encounter: Payer: Self-pay | Admitting: Internal Medicine

## 2020-02-06 ENCOUNTER — Telehealth: Payer: Self-pay | Admitting: *Deleted

## 2020-02-06 NOTE — Telephone Encounter (Signed)
Patient returned call about results.  

## 2020-02-06 NOTE — Telephone Encounter (Signed)
Spoke with patient and reviewed lab results. Appointment has been scheduled.

## 2020-02-22 ENCOUNTER — Ambulatory Visit: Payer: 59 | Admitting: Internal Medicine

## 2020-02-22 ENCOUNTER — Other Ambulatory Visit: Payer: Self-pay

## 2020-02-22 ENCOUNTER — Encounter: Payer: Self-pay | Admitting: Internal Medicine

## 2020-02-22 VITALS — BP 110/80 | HR 66 | Temp 98.1°F | Wt 197.5 lb

## 2020-02-22 DIAGNOSIS — N529 Male erectile dysfunction, unspecified: Secondary | ICD-10-CM | POA: Diagnosis not present

## 2020-02-22 DIAGNOSIS — E559 Vitamin D deficiency, unspecified: Secondary | ICD-10-CM | POA: Diagnosis not present

## 2020-02-22 DIAGNOSIS — I10 Essential (primary) hypertension: Secondary | ICD-10-CM

## 2020-02-22 DIAGNOSIS — R7302 Impaired glucose tolerance (oral): Secondary | ICD-10-CM

## 2020-02-22 NOTE — Patient Instructions (Signed)
-  Nice seeing you today!!  -Schedule follow up in 6 months. 

## 2020-02-22 NOTE — Progress Notes (Signed)
Established Patient Office Visit     This visit occurred during the SARS-CoV-2 public health emergency.  Safety protocols were in place, including screening questions prior to the visit, additional usage of staff PPE, and extensive cleaning of exam room while observing appropriate contact time as indicated for disinfecting solutions.    CC/Reason for Visit: Follow-up chronic conditions  HPI: Willie Ferguson is a 54 y.o. male who is coming in today for the above mentioned reasons. Past Medical History is significant for: Hypertension, erectile dysfunction.  During his physical in September he was also diagnosed with impaired glucose tolerance and vitamin D deficiency.  He is doing well.  He still has 6 weeks left of vitamin D.  He has no complaints.  He received his flu vaccine last visit.   Past Medical/Surgical History: Past Medical History:  Diagnosis Date  . Snoring     No past surgical history on file.  Social History:  reports that he has never smoked. He has never used smokeless tobacco. He reports that he does not drink alcohol and does not use drugs.  Allergies: No Known Allergies  Family History:  Family History  Problem Relation Age of Onset  . Hypertension Mother   . Hypertension Brother   . Hypertension Brother      Current Outpatient Medications:  .  losartan (COZAAR) 100 MG tablet, Take 1 tablet (100 mg total) by mouth daily., Disp: 90 tablet, Rfl: 1 .  sildenafil (VIAGRA) 100 MG tablet, Take 0.5-1 tablets (50-100 mg total) by mouth daily as needed for erectile dysfunction., Disp: 10 tablet, Rfl: 11 .  Vitamin D, Ergocalciferol, (DRISDOL) 1.25 MG (50000 UNIT) CAPS capsule, Take 1 capsule (50,000 Units total) by mouth every 7 (seven) days for 12 doses., Disp: 12 capsule, Rfl: 0  Review of Systems:  Constitutional: Denies fever, chills, diaphoresis, appetite change and fatigue.  HEENT: Denies photophobia, eye pain, redness, hearing loss, ear pain,  congestion, sore throat, rhinorrhea, sneezing, mouth sores, trouble swallowing, neck pain, neck stiffness and tinnitus.   Respiratory: Denies SOB, DOE, cough, chest tightness,  and wheezing.   Cardiovascular: Denies chest pain, palpitations and leg swelling.  Gastrointestinal: Denies nausea, vomiting, abdominal pain, diarrhea, constipation, blood in stool and abdominal distention.  Genitourinary: Denies dysuria, urgency, frequency, hematuria, flank pain and difficulty urinating.  Endocrine: Denies: hot or cold intolerance, sweats, changes in hair or nails, polyuria, polydipsia. Musculoskeletal: Denies myalgias, back pain, joint swelling, arthralgias and gait problem.  Skin: Denies pallor, rash and wound.  Neurological: Denies dizziness, seizures, syncope, weakness, light-headedness, numbness and headaches.  Hematological: Denies adenopathy. Easy bruising, personal or family bleeding history  Psychiatric/Behavioral: Denies suicidal ideation, mood changes, confusion, nervousness, sleep disturbance and agitation    Physical Exam: Vitals:   02/22/20 0943  BP: 110/80  Pulse: 66  Temp: 98.1 F (36.7 C)  TempSrc: Oral  SpO2: 97%  Weight: 197 lb 8 oz (89.6 kg)    Body mass index is 30.03 kg/m.   Constitutional: NAD, calm, comfortable Eyes: PERRL, lids and conjunctivae normal, wears corrective lenses ENMT: Mucous membranes are moist.  Respiratory: clear to auscultation bilaterally, no wheezing, no crackles. Normal respiratory effort. No accessory muscle use.  Cardiovascular: Regular rate and rhythm, no murmurs / rubs / gallops. No extremity edema. Neurologic: Grossly intact and nonfocal Psychiatric: Normal judgment and insight. Alert and oriented x 3. Normal mood.    Impression and Plan:  Primary hypertension -Well-controlled on losartan 100 mg daily.  IGT (impaired  glucose tolerance) -Last A1c was 5.7 in September, monitor every 6 months, continue lifestyle  modifications.  Vitamin D deficiency -Recheck levels once he completes 12 weeks of high-dose weekly supplementation.  Erectile dysfunction, unspecified erectile dysfunction type -On as needed sildenafil.   Patient Instructions  -Nice seeing you today!!  -Schedule follow up in 6 months.     Chaya Jan, MD [facility]

## 2020-03-18 ENCOUNTER — Other Ambulatory Visit: Payer: Self-pay | Admitting: Internal Medicine

## 2020-03-18 DIAGNOSIS — E559 Vitamin D deficiency, unspecified: Secondary | ICD-10-CM

## 2020-03-28 ENCOUNTER — Telehealth: Payer: Self-pay | Admitting: Internal Medicine

## 2020-03-28 NOTE — Telephone Encounter (Signed)
error 

## 2020-06-23 ENCOUNTER — Other Ambulatory Visit: Payer: Self-pay | Admitting: Emergency Medicine

## 2020-06-23 DIAGNOSIS — N529 Male erectile dysfunction, unspecified: Secondary | ICD-10-CM

## 2020-06-23 NOTE — Telephone Encounter (Signed)
Rerouting to [facility]- [facility]

## 2020-07-16 ENCOUNTER — Other Ambulatory Visit: Payer: Self-pay

## 2020-07-16 DIAGNOSIS — N529 Male erectile dysfunction, unspecified: Secondary | ICD-10-CM

## 2020-07-17 MED ORDER — SILDENAFIL CITRATE 100 MG PO TABS: 50.0000 mg | ORAL_TABLET | Freq: Every day | ORAL | 11 refills | Status: DC | PRN

## 2020-07-17 NOTE — Telephone Encounter (Signed)
Historical medication.  Last filled by Georgina Quint, MD.  Okay to fill?

## 2020-08-22 ENCOUNTER — Other Ambulatory Visit: Payer: Self-pay

## 2020-08-22 ENCOUNTER — Ambulatory Visit: Payer: 59 | Admitting: Internal Medicine

## 2020-08-22 ENCOUNTER — Encounter: Payer: Self-pay | Admitting: Internal Medicine

## 2020-08-22 VITALS — BP 130/80 | HR 69 | Temp 97.6°F | Wt 202.8 lb

## 2020-08-22 DIAGNOSIS — R7302 Impaired glucose tolerance (oral): Secondary | ICD-10-CM | POA: Diagnosis not present

## 2020-08-22 DIAGNOSIS — M545 Low back pain, unspecified: Secondary | ICD-10-CM

## 2020-08-22 DIAGNOSIS — I10 Essential (primary) hypertension: Secondary | ICD-10-CM | POA: Diagnosis not present

## 2020-08-22 DIAGNOSIS — N529 Male erectile dysfunction, unspecified: Secondary | ICD-10-CM | POA: Diagnosis not present

## 2020-08-22 DIAGNOSIS — E559 Vitamin D deficiency, unspecified: Secondary | ICD-10-CM

## 2020-08-22 LAB — POCT GLYCOSYLATED HEMOGLOBIN (HGB A1C): Hemoglobin A1C: 5.6 % (ref 4.0–5.6)

## 2020-08-22 MED ORDER — CYCLOBENZAPRINE HCL 5 MG PO TABS: 5.0000 mg | ORAL_TABLET | Freq: Every evening | ORAL | 1 refills | Status: AC | PRN

## 2020-08-22 NOTE — Progress Notes (Signed)
Established Patient Office Visit     This visit occurred during the SARS-CoV-2 public health emergency.  Safety protocols were in place, including screening questions prior to the visit, additional usage of staff PPE, and extensive cleaning of exam room while observing appropriate contact time as indicated for disinfecting solutions.    CC/Reason for Visit: Discuss acute lower back pain and follow-up chronic medical conditions  HPI: Willie Ferguson is a 55 y.o. male who is coming in today for the above mentioned reasons. Past Medical History is significant for: Hypertension, erectile dysfunction, impaired glucose tolerance and vitamin D deficiency.  This visit was scheduled to follow his A1c.  Yesterday he was bending over to grab something off the floor and when he went to stand up straight he felt immediate left lower back pain.  No leg radiculopathy.  He has been tender ever since.   Past Medical/Surgical History: Past Medical History:  Diagnosis Date  . Snoring     No past surgical history on file.  Social History:  reports that he has never smoked. He has never used smokeless tobacco. He reports that he does not drink alcohol and does not use drugs.  Allergies: No Known Allergies  Family History:  Family History  Problem Relation Age of Onset  . Hypertension Mother   . Hypertension Brother   . Hypertension Brother      Current Outpatient Medications:  .  cyclobenzaprine (FLEXERIL) 5 MG tablet, Take 1 tablet (5 mg total) by mouth at bedtime as needed for muscle spasms., Disp: 30 tablet, Rfl: 1 .  losartan (COZAAR) 100 MG tablet, Take 1 tablet (100 mg total) by mouth daily., Disp: 90 tablet, Rfl: 1 .  sildenafil (VIAGRA) 100 MG tablet, Take 0.5-1 tablets (50-100 mg total) by mouth daily as needed for erectile dysfunction., Disp: 10 tablet, Rfl: 11  Review of Systems:  Constitutional: Denies fever, chills, diaphoresis, appetite change and fatigue.  HEENT:  Denies photophobia, eye pain, redness, hearing loss, ear pain, congestion, sore throat, rhinorrhea, sneezing, mouth sores, trouble swallowing, neck pain, neck stiffness and tinnitus.   Respiratory: Denies SOB, DOE, cough, chest tightness,  and wheezing.   Cardiovascular: Denies chest pain, palpitations and leg swelling.  Gastrointestinal: Denies nausea, vomiting, abdominal pain, diarrhea, constipation, blood in stool and abdominal distention.  Genitourinary: Denies dysuria, urgency, frequency, hematuria, flank pain and difficulty urinating.  Endocrine: Denies: hot or cold intolerance, sweats, changes in hair or nails, polyuria, polydipsia. Musculoskeletal: Denies joint swelling, arthralgias and gait problem.  Skin: Denies pallor, rash and wound.  Neurological: Denies dizziness, seizures, syncope, weakness, light-headedness, numbness and headaches.  Hematological: Denies adenopathy. Easy bruising, personal or family bleeding history  Psychiatric/Behavioral: Denies suicidal ideation, mood changes, confusion, nervousness, sleep disturbance and agitation    Physical Exam: Vitals:   08/22/20 0904  BP: 130/80  Pulse: 69  Temp: 97.6 F (36.4 C)  TempSrc: Oral  SpO2: 98%  Weight: 202 lb 12.8 oz (92 kg)    Body mass index is 30.84 kg/m.   Constitutional: NAD, calm, comfortable Eyes: PERRL, lids and conjunctivae normal ENMT: Mucous membranes are moist.  Respiratory: clear to auscultation bilaterally, no wheezing, no crackles. Normal respiratory effort. No accessory muscle use.  Cardiovascular: Regular rate and rhythm, no murmurs / rubs / gallops. No extremity edema. 2 Neurologic: Grossly intact and nonfocal Psychiatric: Normal judgment and insight. Alert and oriented x 3. Normal mood.    Impression and Plan:  IGT (impaired glucose tolerance)  -In  office A1c of 5.6 today, he will continue lifestyle modifications.  Acute left-sided low back pain without sciatica -Suspect  musculoskeletal in origin. -Have advised as needed NSAIDs, icing, back stretches and local massage therapy.  If no improvement in around 6 weeks can consider referral to physical therapy. -He will also be given some muscle relaxers to take at bedtime as needed.  Primary hypertension -Fair control today.  Erectile dysfunction, unspecified erectile dysfunction type -On as needed sildenafil.  Vitamin D deficiency -Recheck levels when he returns for CPE.   Patient Instructions  -Nice seeing you today!!  -For your back: icing 3 times a day, daily back stretches, as needed ibuprofen and local massage therapy. Muscle relaxers at bedtime as needed.  -Schedule follow up in 6 months for your physical.     Chaya Jan, MD [facility]

## 2020-08-22 NOTE — Patient Instructions (Signed)
-  Nice seeing you today!!  -For your back: icing 3 times a day, daily back stretches, as needed ibuprofen and local massage therapy. Muscle relaxers at bedtime as needed.  -Schedule follow up in 6 months for your physical.

## 2021-02-13 ENCOUNTER — Encounter: Payer: 59 | Admitting: Internal Medicine

## 2021-02-20 ENCOUNTER — Ambulatory Visit (INDEPENDENT_AMBULATORY_CARE_PROVIDER_SITE_OTHER): Payer: 59 | Admitting: Internal Medicine

## 2021-02-20 ENCOUNTER — Encounter: Payer: Self-pay | Admitting: Internal Medicine

## 2021-02-20 VITALS — BP 170/100 | HR 82 | Temp 98.3°F | Ht 68.0 in | Wt 206.4 lb

## 2021-02-20 DIAGNOSIS — Z0000 Encounter for general adult medical examination without abnormal findings: Secondary | ICD-10-CM

## 2021-02-20 DIAGNOSIS — I10 Essential (primary) hypertension: Secondary | ICD-10-CM | POA: Diagnosis not present

## 2021-02-20 DIAGNOSIS — Z1211 Encounter for screening for malignant neoplasm of colon: Secondary | ICD-10-CM | POA: Diagnosis not present

## 2021-02-20 DIAGNOSIS — R7302 Impaired glucose tolerance (oral): Secondary | ICD-10-CM | POA: Diagnosis not present

## 2021-02-20 DIAGNOSIS — Z125 Encounter for screening for malignant neoplasm of prostate: Secondary | ICD-10-CM

## 2021-02-20 DIAGNOSIS — E559 Vitamin D deficiency, unspecified: Secondary | ICD-10-CM | POA: Diagnosis not present

## 2021-02-20 DIAGNOSIS — N529 Male erectile dysfunction, unspecified: Secondary | ICD-10-CM

## 2021-02-20 LAB — CBC WITH DIFFERENTIAL/PLATELET
Basophils Absolute: 0 10*3/uL (ref 0.0–0.1)
Basophils Relative: 0.3 % (ref 0.0–3.0)
Eosinophils Absolute: 0.2 10*3/uL (ref 0.0–0.7)
Eosinophils Relative: 3.1 % (ref 0.0–5.0)
HCT: 44.2 % (ref 39.0–52.0)
Hemoglobin: 14.9 g/dL (ref 13.0–17.0)
Lymphocytes Relative: 28.9 % (ref 12.0–46.0)
Lymphs Abs: 1.7 10*3/uL (ref 0.7–4.0)
MCHC: 33.8 g/dL (ref 30.0–36.0)
MCV: 85.8 fl (ref 78.0–100.0)
Monocytes Absolute: 0.5 10*3/uL (ref 0.1–1.0)
Monocytes Relative: 7.8 % (ref 3.0–12.0)
Neutro Abs: 3.6 10*3/uL (ref 1.4–7.7)
Neutrophils Relative %: 59.9 % (ref 43.0–77.0)
Platelets: 201 10*3/uL (ref 150.0–400.0)
RBC: 5.15 Mil/uL (ref 4.22–5.81)
RDW: 14.4 % (ref 11.5–15.5)
WBC: 6 10*3/uL (ref 4.0–10.5)

## 2021-02-20 LAB — COMPREHENSIVE METABOLIC PANEL
ALT: 15 U/L (ref 0–53)
AST: 15 U/L (ref 0–37)
Albumin: 4.3 g/dL (ref 3.5–5.2)
Alkaline Phosphatase: 43 U/L (ref 39–117)
BUN: 5 mg/dL — ABNORMAL LOW (ref 6–23)
CO2: 26 mEq/L (ref 19–32)
Calcium: 9 mg/dL (ref 8.4–10.5)
Chloride: 103 mEq/L (ref 96–112)
Creatinine, Ser: 1.05 mg/dL (ref 0.40–1.50)
GFR: 80.55 mL/min (ref >60.00)
Glucose, Bld: 98 mg/dL (ref 70–99)
Potassium: 3.9 mEq/L (ref 3.5–5.1)
Sodium: 139 mEq/L (ref 135–145)
Total Bilirubin: 0.6 mg/dL (ref 0.2–1.2)
Total Protein: 6.7 g/dL (ref 6.0–8.3)

## 2021-02-20 LAB — VITAMIN B12: Vitamin B-12: 191 pg/mL — ABNORMAL LOW (ref 211–911)

## 2021-02-20 LAB — LIPID PANEL
Cholesterol: 164 mg/dL (ref 0–200)
HDL: 37.1 mg/dL — ABNORMAL LOW (ref >39.00)
LDL Cholesterol: 115 mg/dL — ABNORMAL HIGH (ref 0–99)
NonHDL: 127.11
Total CHOL/HDL Ratio: 4
Triglycerides: 62 mg/dL (ref 0.0–149.0)
VLDL: 12.4 mg/dL (ref 0.0–40.0)

## 2021-02-20 LAB — PSA: PSA: 0.43 ng/mL (ref 0.10–4.00)

## 2021-02-20 LAB — HEMOGLOBIN A1C: Hgb A1c MFr Bld: 5.9 % (ref 4.6–6.5)

## 2021-02-20 LAB — TSH: TSH: 1.3 u[IU]/mL (ref 0.35–5.50)

## 2021-02-20 LAB — VITAMIN D 25 HYDROXY (VIT D DEFICIENCY, FRACTURES): VITD: 16.79 ng/mL — ABNORMAL LOW (ref 30.00–100.00)

## 2021-02-20 MED ORDER — LOSARTAN POTASSIUM 100 MG PO TABS: 100.0000 mg | ORAL_TABLET | Freq: Every day | ORAL | 1 refills | Status: DC

## 2021-02-20 NOTE — Patient Instructions (Signed)
-  Nice seeing you today!!  -Lab work today; will notify you once results are available.  -Remember your flu vaccine and COVID booster.  -Resume losartan 100 mg daily.  -Schedule follow up in 6-8 weeks for your blood pressure.Willie Ferguson

## 2021-02-20 NOTE — Progress Notes (Signed)
Established Patient Office Visit     This visit occurred during the SARS-CoV-2 public health emergency.  Safety protocols were in place, including screening questions prior to the visit, additional usage of staff PPE, and extensive cleaning of exam room while observing appropriate contact time as indicated for disinfecting solutions.    CC/Reason for Visit: Annual preventive exam  HPI: Willie Ferguson is a 55 y.o. male who is coming in today for the above mentioned reasons. Past Medical History is significant for: Hypertension, vitamin D deficiency, impaired glucose tolerance and erectile dysfunction.  He has not been taking his blood pressure medication since June as he ran out and did not think to request a refill.  He has routine eye and dental care.  He exercises by walking 3 miles 3 days a week.  He is due for his flu vaccine and his COVID booster.  He had a colonoscopy in January 2017 and is a 5-year callback per report.  He has been having some headaches but other than this has no acute concerns or complaints.   Past Medical/Surgical History: Past Medical History:  Diagnosis Date   Snoring     No past surgical history on file.  Social History:  reports that he has never smoked. He has never used smokeless tobacco. He reports that he does not drink alcohol and does not use drugs.  Allergies: No Known Allergies  Family History:  Family History  Problem Relation Age of Onset   Hypertension Mother    Hypertension Brother    Hypertension Brother      Current Outpatient Medications:    cyclobenzaprine (FLEXERIL) 5 MG tablet, Take 1 tablet (5 mg total) by mouth at bedtime as needed for muscle spasms., Disp: 30 tablet, Rfl: 1   sildenafil (VIAGRA) 100 MG tablet, Take 0.5-1 tablets (50-100 mg total) by mouth daily as needed for erectile dysfunction., Disp: 10 tablet, Rfl: 11   losartan (COZAAR) 100 MG tablet, Take 1 tablet (100 mg total) by mouth daily., Disp: 90  tablet, Rfl: 1  Review of Systems:  Constitutional: Denies fever, chills, diaphoresis, appetite change and fatigue.  HEENT: Denies photophobia, eye pain, redness, hearing loss, ear pain, congestion, sore throat, rhinorrhea, sneezing, mouth sores, trouble swallowing, neck pain, neck stiffness and tinnitus.   Respiratory: Denies SOB, DOE, cough, chest tightness,  and wheezing.   Cardiovascular: Denies chest pain, palpitations and leg swelling.  Gastrointestinal: Denies nausea, vomiting, abdominal pain, diarrhea, constipation, blood in stool and abdominal distention.  Genitourinary: Denies dysuria, urgency, frequency, hematuria, flank pain and difficulty urinating.  Endocrine: Denies: hot or cold intolerance, sweats, changes in hair or nails, polyuria, polydipsia. Musculoskeletal: Denies myalgias, back pain, joint swelling, arthralgias and gait problem.  Skin: Denies pallor, rash and wound.  Neurological: Denies dizziness, seizures, syncope, weakness, light-headedness, numbness. Hematological: Denies adenopathy. Easy bruising, personal or family bleeding history  Psychiatric/Behavioral: Denies suicidal ideation, mood changes, confusion, nervousness, sleep disturbance and agitation    Physical Exam: Vitals:   02/20/21 0830 02/20/21 0856  BP: (!) 180/96 (!) 170/100  Pulse: 82   Temp: 98.3 F (36.8 C)   TempSrc: Oral   SpO2: 98%   Weight: 206 lb 6.4 oz (93.6 kg)   Height: 5\' 8"  (1.727 m)     Body mass index is 31.38 kg/m.   Constitutional: NAD, calm, comfortable Eyes: PERRL, lids and conjunctivae normal ENMT: Mucous membranes are moist. Posterior pharynx clear of any exudate or lesions. Normal dentition. Tympanic membrane is  pearly white, no erythema or bulging. Neck: normal, supple, no masses, no thyromegaly Respiratory: clear to auscultation bilaterally, no wheezing, no crackles. Normal respiratory effort. No accessory muscle use.  Cardiovascular: Regular rate and rhythm, no  murmurs / rubs / gallops. No extremity edema. 2+ pedal pulses. No carotid bruits.  Abdomen: no tenderness, no masses palpated. No hepatosplenomegaly. Bowel sounds positive.  Musculoskeletal: no clubbing / cyanosis. No joint deformity upper and lower extremities. Good ROM, no contractures. Normal muscle tone.  Skin: no rashes, lesions, ulcers. No induration Neurologic: CN 2-12 grossly intact. Sensation intact, DTR normal. Strength 5/5 in all 4.  Psychiatric: Normal judgment and insight. Alert and oriented x 3. Normal mood.    Impression and Plan:  Encounter for preventive health examination  -Recommend routine eye and dental care. -Immunizations: Due for flu vaccine and COVID booster, declines both today despite counseling. -Healthy lifestyle discussed in detail. -Labs to be updated today. -Colon cancer screening: Referred to GI as he is overdue for his 5-year follow-up -Breast cancer screening: Not applicable -Cervical cancer screening: Not applicable -Lung cancer screening: Not applicable -Prostate cancer screening: PSA today -DEXA: Not applicable  Screening for malignant neoplasm of colon  - Plan: Ambulatory referral to Gastroenterology  Vitamin D deficiency  - Plan: VITAMIN D 25 Hydroxy (Vit-D Deficiency, Fractures)  IGT (impaired glucose tolerance)  - Plan: Hemoglobin A1c  Erectile dysfunction, unspecified erectile dysfunction type -On as needed sildenafil.  Essential hypertension  - Plan: losartan (COZAAR) 100 MG tablet -Blood pressure is uncontrolled today on 2 separate measurements, believe likely secondary to him not on medication. -Headaches that he has been experiencing are likely related to elevated blood pressure. -Resume losartan 100 mg daily, schedule follow-up in 6 to 8 weeks.    Patient Instructions  -Nice seeing you today!!  -Lab work today; will notify you once results are available.  -Remember your flu vaccine and COVID booster.  -Resume losartan  100 mg daily.  -Schedule follow up in 6-8 weeks for your blood pressure.Chaya Jan, MD [facility] at [facility]

## 2021-02-22 ENCOUNTER — Encounter: Payer: Self-pay | Admitting: Internal Medicine

## 2021-02-22 ENCOUNTER — Other Ambulatory Visit: Payer: Self-pay | Admitting: Internal Medicine

## 2021-02-22 DIAGNOSIS — E559 Vitamin D deficiency, unspecified: Secondary | ICD-10-CM

## 2021-02-22 DIAGNOSIS — E538 Deficiency of other specified B group vitamins: Secondary | ICD-10-CM | POA: Insufficient documentation

## 2021-02-22 MED ORDER — VITAMIN D (ERGOCALCIFEROL) 1.25 MG (50000 UNIT) PO CAPS: 50000.0000 [IU] | ORAL_CAPSULE | ORAL | 0 refills | Status: AC

## 2021-03-15 ENCOUNTER — Encounter: Payer: Self-pay | Admitting: Internal Medicine

## 2021-04-09 ENCOUNTER — Encounter: Payer: Self-pay | Admitting: Emergency Medicine

## 2021-04-09 ENCOUNTER — Other Ambulatory Visit: Payer: Self-pay

## 2021-04-09 ENCOUNTER — Ambulatory Visit
Admission: EM | Admit: 2021-04-09 | Discharge: 2021-04-09 | Disposition: A | Discharge status: Home / Self Care | Payer: 59 | Attending: Physician Assistant | Admitting: Physician Assistant

## 2021-04-09 DIAGNOSIS — J069 Acute upper respiratory infection, unspecified: Secondary | ICD-10-CM

## 2021-04-09 NOTE — ED Triage Notes (Signed)
Patient c/o non-productive cough, sore throat and congestion x 2 days.  Patient has taken Delsym.  Patient is vaccinated for COVID.

## 2021-04-09 NOTE — ED Provider Notes (Signed)
[facility]    CSN: [id number] Arrival date & time: 04/09/21  1044      History   Chief Complaint Chief Complaint  Patient presents with   Cough    HPI Willie Ferguson is a 56 y.o. male.   Patient here today for evaluation of cough, sore throat and congestion he has had for the last 2 days. He has tried delsym without significant relief. He has not had fever.   The history is provided by the patient.  Cough Associated symptoms: sore throat   Associated symptoms: no chills, no ear pain, no eye discharge, no fever and no shortness of breath    Past Medical History:  Diagnosis Date   Snoring     Patient Active Problem List   Diagnosis Date Noted   Vitamin B12 deficiency 02/22/2021   Vitamin D deficiency 12/22/2019   IGT (impaired glucose tolerance) 12/22/2019   ED (erectile dysfunction) 08/31/2019   Hypertension 03/03/2015   Witnessed apneic spells 03/03/2015    History reviewed. No pertinent surgical history.     Home Medications    Prior to Admission medications   Medication Sig Start Date End Date Taking? Authorizing Provider  cyclobenzaprine (FLEXERIL) 5 MG tablet Take 1 tablet (5 mg total) by mouth at bedtime as needed for muscle spasms. 08/22/20  Yes Philip Aspen, Limmie Patricia, MD  losartan (COZAAR) 100 MG tablet Take 1 tablet (100 mg total) by mouth daily. 02/20/21  Yes Philip Aspen, Limmie Patricia, MD  sildenafil (VIAGRA) 100 MG tablet Take 0.5-1 tablets (50-100 mg total) by mouth daily as needed for erectile dysfunction. 07/17/20  Yes Philip Aspen, Limmie Patricia, MD  Vitamin D, Ergocalciferol, (DRISDOL) 1.25 MG (50000 UNIT) CAPS capsule Take 1 capsule (50,000 Units total) by mouth every 7 (seven) days for 12 doses. 02/22/21 05/11/21 Yes Henderson Cloud, MD    Family History Family History  Problem Relation Age of Onset   Hypertension Mother    Hypertension Brother    Hypertension Brother     Social History Social History    Tobacco Use   Smoking status: Never   Smokeless tobacco: Never  Substance Use Topics   Alcohol use: No    Alcohol/week: 0.0 standard drinks   Drug use: No     Allergies   Patient has no known allergies.   Review of Systems Review of Systems  Constitutional:  Negative for chills and fever.  HENT:  Positive for congestion and sore throat. Negative for ear pain.   Eyes:  Negative for discharge and redness.  Respiratory:  Positive for cough. Negative for shortness of breath.   Gastrointestinal:  Negative for abdominal pain, nausea and vomiting.    Physical Exam Triage Vital Signs ED Triage Vitals  Enc Vitals Group     BP 04/09/21 1139 (!) 142/77     Pulse Rate 04/09/21 1139 (!) 102     Resp 04/09/21 1139 18     Temp 04/09/21 1139 98.8 F (37.1 C)     Temp Source 04/09/21 1139 Oral     SpO2 04/09/21 1139 93 %     Weight 04/09/21 1140 206 lb 5.6 oz (93.6 kg)     Height 04/09/21 1140 5\' 8"  (1.727 m)     Head Circumference --      Peak Flow --      Pain Score 04/09/21 1139 3     Pain Loc --      Pain Edu? --  Excl. in GC? --    No data found.  Updated Vital Signs BP (!) 142/77 (BP Location: Left Arm)    Pulse (!) 102    Temp 98.8 F (37.1 C) (Oral)    Resp 18    Ht 5\' 8"  (1.727 m)    Wt 206 lb 5.6 oz (93.6 kg)    SpO2 93%    BMI 31.38 kg/m      Physical Exam Vitals and nursing note reviewed.  Constitutional:      General: He is not in acute distress.    Appearance: Normal appearance. He is not ill-appearing.  HENT:     Head: Normocephalic and atraumatic.     Nose: Congestion present.     Mouth/Throat:     Mouth: Mucous membranes are moist.     Pharynx: Oropharynx is clear. No oropharyngeal exudate or posterior oropharyngeal erythema.  Eyes:     Conjunctiva/sclera: Conjunctivae normal.  Cardiovascular:     Rate and Rhythm: Normal rate and regular rhythm.     Heart sounds: Normal heart sounds. No murmur heard. Pulmonary:     Effort: Pulmonary effort  is normal. No respiratory distress.     Breath sounds: Normal breath sounds. No wheezing, rhonchi or rales.  Skin:    General: Skin is warm and dry.  Neurological:     Mental Status: He is alert.  Psychiatric:        Mood and Affect: Mood normal.        Thought Content: Thought content normal.     UC Treatments / Results  Labs (all labs ordered are listed, but only abnormal results are displayed) Labs Reviewed  COVID-19, FLU A+B NAA    EKG   Radiology No results found.  Procedures Procedures (including critical care time)  Medications Ordered in UC Medications - No data to display  Initial Impression / Assessment and Plan / UC Course  I have reviewed the triage vital signs and the nursing notes.  Pertinent labs & imaging results that were available during my care of the patient were reviewed by me and considered in my medical decision making (see chart for details).    Suspect viral etiology of symptoms. Will order covid and flu screening and encouraged symptomatic treatment in the meantime. Encouraged follow up with any further concerns.  Final Clinical Impressions(s) / UC Diagnoses   Final diagnoses:  Acute upper respiratory infection   Discharge Instructions   None    ED Prescriptions   None    PDMP not reviewed this encounter.   , PA-C 04/09/21 1239

## 2021-04-11 LAB — COVID-19, FLU A+B NAA
Influenza A, NAA: NOT DETECTED
Influenza B, NAA: NOT DETECTED
SARS-CoV-2, NAA: DETECTED — AB

## 2021-04-24 ENCOUNTER — Ambulatory Visit: Payer: 59 | Admitting: Internal Medicine

## 2021-05-01 ENCOUNTER — Ambulatory Visit: Payer: 59 | Admitting: Internal Medicine

## 2021-05-01 ENCOUNTER — Telehealth: Payer: Self-pay | Admitting: Internal Medicine

## 2021-05-01 VITALS — BP 120/76 | HR 92 | Temp 98.2°F | Wt 203.0 lb

## 2021-05-01 DIAGNOSIS — E559 Vitamin D deficiency, unspecified: Secondary | ICD-10-CM | POA: Diagnosis not present

## 2021-05-01 DIAGNOSIS — R7302 Impaired glucose tolerance (oral): Secondary | ICD-10-CM

## 2021-05-01 DIAGNOSIS — I10 Essential (primary) hypertension: Secondary | ICD-10-CM

## 2021-05-01 DIAGNOSIS — E538 Deficiency of other specified B group vitamins: Secondary | ICD-10-CM

## 2021-05-01 DIAGNOSIS — Z23 Encounter for immunization: Secondary | ICD-10-CM | POA: Diagnosis not present

## 2021-05-01 LAB — VITAMIN D 25 HYDROXY (VIT D DEFICIENCY, FRACTURES): VITD: 40.27 ng/mL (ref 30.00–100.00)

## 2021-05-01 LAB — HEMOGLOBIN A1C: Hgb A1c MFr Bld: 5.9 % (ref 4.6–6.5)

## 2021-05-01 MED ORDER — CYANOCOBALAMIN 1000 MCG/ML IJ SOLN
1000.0000 ug | Freq: Once | INTRAMUSCULAR | Status: AC
  Administered 2021-05-01: 09:00:00 1000 ug via INTRAMUSCULAR

## 2021-05-01 MED ORDER — CYANOCOBALAMIN 1000 MCG/ML IJ SOLN: 1000.0000 ug | INTRAMUSCULAR | 6 refills | Status: DC

## 2021-05-01 MED ORDER — "BD ECLIPSE SYRINGE 25G X 1"" 3 ML MISC": 1.0000 | 7 refills | Status: DC

## 2021-05-01 NOTE — Telephone Encounter (Signed)
Patient is interested in giving himself his B12 injections.  He wants to know he he goes about getting the supplies for this?  He has a nurse visit scheduled for February that may need to be cancelled if he isn't coming in.    [phone]

## 2021-05-01 NOTE — Telephone Encounter (Signed)
AVS stated for pt to be seen in 6 mths for a CPE.  CPE isn't due until November.  Would you like a 6 mth f/u for pt in July?

## 2021-05-01 NOTE — Patient Instructions (Signed)
-  Nice seeing you today!!  -Lab work today; will notify you once results are available.  -Flu vaccine and B12 injection today.  -Schedule your physical in 6 months and come in fasting that day.  -Make sure you schedule a monthly nurse visit for your B12 injections.

## 2021-05-01 NOTE — Progress Notes (Signed)
Established Patient Office Visit     This visit occurred during the SARS-CoV-2 public health emergency.  Safety protocols were in place, including screening questions prior to the visit, additional usage of staff PPE, and extensive cleaning of exam room while observing appropriate contact time as indicated for disinfecting solutions.    CC/Reason for Visit: Follow-up chronic medical conditions  HPI: Kyus Zerkel Burdo is a 56 y.o. male who is coming in today for the above mentioned reasons. Past Medical History is significant for: Hypertension, erectile dysfunction, impaired glucose tolerance, vitamin D and B12 deficiency.  He has completed vitamin D supplementation.  Has not yet started vitamin B12 supplementation.  He is feeling well other than fatigue.  At last visit his blood pressure was quite elevated at 180/96 but has now normalized to 120/76 now that he is back on losartan.   Past Medical/Surgical History: Past Medical History:  Diagnosis Date   Snoring     No past surgical history on file.  Social History:  reports that he has never smoked. He has never used smokeless tobacco. He reports that he does not drink alcohol and does not use drugs.  Allergies: No Known Allergies  Family History:  Family History  Problem Relation Age of Onset   Hypertension Mother    Hypertension Brother    Hypertension Brother      Current Outpatient Medications:    losartan (COZAAR) 100 MG tablet, Take 1 tablet (100 mg total) by mouth daily., Disp: 90 tablet, Rfl: 1   sildenafil (VIAGRA) 100 MG tablet, Take 0.5-1 tablets (50-100 mg total) by mouth daily as needed for erectile dysfunction., Disp: 10 tablet, Rfl: 11   cyclobenzaprine (FLEXERIL) 5 MG tablet, Take 1 tablet (5 mg total) by mouth at bedtime as needed for muscle spasms. (Patient not taking: Reported on 05/01/2021), Disp: 30 tablet, Rfl: 1   Vitamin D, Ergocalciferol, (DRISDOL) 1.25 MG (50000 UNIT) CAPS capsule, Take 1  capsule (50,000 Units total) by mouth every 7 (seven) days for 12 doses. (Patient not taking: Reported on 05/01/2021), Disp: 12 capsule, Rfl: 0  Review of Systems:  Constitutional: Denies fever, chills, diaphoresis, appetite change and fatigue.  HEENT: Denies photophobia, eye pain, redness, hearing loss, ear pain, congestion, sore throat, rhinorrhea, sneezing, mouth sores, trouble swallowing, neck pain, neck stiffness and tinnitus.   Respiratory: Denies SOB, DOE, cough, chest tightness,  and wheezing.   Cardiovascular: Denies chest pain, palpitations and leg swelling.  Gastrointestinal: Denies nausea, vomiting, abdominal pain, diarrhea, constipation, blood in stool and abdominal distention.  Genitourinary: Denies dysuria, urgency, frequency, hematuria, flank pain and difficulty urinating.  Endocrine: Denies: hot or cold intolerance, sweats, changes in hair or nails, polyuria, polydipsia. Musculoskeletal: Denies myalgias, back pain, joint swelling, arthralgias and gait problem.  Skin: Denies pallor, rash and wound.  Neurological: Denies dizziness, seizures, syncope, weakness, light-headedness, numbness and headaches.  Hematological: Denies adenopathy. Easy bruising, personal or family bleeding history  Psychiatric/Behavioral: Denies suicidal ideation, mood changes, confusion, nervousness, sleep disturbance and agitation    Physical Exam: Vitals:   05/01/21 0811  BP: 120/76  Pulse: 92  Temp: 98.2 F (36.8 C)  TempSrc: Oral  SpO2: 95%  Weight: 203 lb (92.1 kg)    Body mass index is 30.87 kg/m.   Constitutional: NAD, calm, comfortable Eyes: PERRL, lids and conjunctivae normal ENMT: Mucous membranes are moist.  Respiratory: clear to auscultation bilaterally, no wheezing, no crackles. Normal respiratory effort. No accessory muscle use.  Cardiovascular: Regular rate and  rhythm, no murmurs / rubs / gallops. No extremity edema.  Neurologic: Grossly intact and nonfocal Psychiatric:  Normal judgment and insight. Alert and oriented x 3. Normal mood.    Impression and Plan:  Primary hypertension -Blood pressures well controlled on losartan 100 mg daily.  IGT (impaired glucose tolerance)  - Plan: Hemoglobin A1c  Vitamin D deficiency  - Plan: VITAMIN D 25 Hydroxy (Vit-D Deficiency, Fractures)  Vitamin B12 deficiency -B12 injection today, he will schedule monthly nurse visits.  Need for influenza vaccination -Flu vaccine today.  Time spent: 30 minutes reviewing chart, interviewing and examining patient and formulating plan of care.   Patient Instructions  -Nice seeing you today!!  -Lab work today; will notify you once results are available.  -Flu vaccine and B12 injection today.  -Schedule your physical in 6 months and come in fasting that day.  -Make sure you schedule a monthly nurse visit for your B12 injections.    Lelon Frohlich, MD [facility] at [facility]

## 2021-05-01 NOTE — Addendum Note (Signed)
Addended by: Claudette Laws D on: 05/01/2021 08:58 AM   Modules accepted: Orders

## 2021-05-01 NOTE — Telephone Encounter (Signed)
Pt states his wife who is a nurse will give him his monthly b12 injections. Verified correct pharmacy to send supplies to, will cancel upcoming nurse visit. Pt instructed that his next injection is due Sat Feb 25 & every month thereafter. Pt verb understanding & has no further ques/concerns at this time.

## 2021-06-05 ENCOUNTER — Ambulatory Visit: Payer: 59

## 2021-07-18 ENCOUNTER — Telehealth: Payer: Self-pay | Admitting: *Deleted

## 2021-07-18 NOTE — Telephone Encounter (Signed)
Dr Leone Payor, ? ?This pt has been referred to [place] for a  screening colonoscopy. In reviewing his chart, he had a colonoscopy in 2016 with Quinn Axe that was normal for blood in stool.  He saw Quinn Axe again 2017 for blood in stool and he states in that note the 2016 colon was normal and he suspected anorectal bleeding  , he did a CBC to check for anemia and HGB was normal.   Is he due for a colon now or should he have a recall for 2026? ?I cannot find any family hx of colon cancer ? ?Please advise, Thanks for your time, Hilda Lias PV  ? ? ?

## 2021-07-19 NOTE — Telephone Encounter (Signed)
Attempted to call pt- we need his 2016 colon report from Quinn Axe at [facility] to return call  ?

## 2021-07-19 NOTE — Telephone Encounter (Signed)
It sounds like next routine colonoscopy not due until 2026 as you indicate - based upon current guidelines. Not sure why they said repeat in 5 years (this is what PCP notes indicate he was told). ? ?I would like to review the procedure report for completeness (cannot view colonoscopy report) so please have him do ROI for that and we should put this on hold until I review and further advise. ? ? ?

## 2021-07-22 NOTE — Telephone Encounter (Signed)
Patient called and notified that we need his last colonoscopy report for our dr to review. He will contact [facility] in [place] and have them fax report to [place]. Pt aware we can not do colonoscopy until we have this report reviewed. Pt denies any GI symptoms and denies any family hx colon cancer.  ?

## 2021-08-07 ENCOUNTER — Other Ambulatory Visit: Payer: Self-pay | Admitting: Internal Medicine

## 2021-08-07 DIAGNOSIS — N529 Male erectile dysfunction, unspecified: Secondary | ICD-10-CM

## 2021-08-29 ENCOUNTER — Other Ambulatory Visit: Payer: Self-pay | Admitting: Internal Medicine

## 2021-08-29 DIAGNOSIS — I10 Essential (primary) hypertension: Secondary | ICD-10-CM

## 2021-09-04 ENCOUNTER — Encounter: Payer: 59 | Admitting: Internal Medicine

## 2021-10-08 ENCOUNTER — Other Ambulatory Visit: Payer: Self-pay | Admitting: Internal Medicine

## 2021-10-08 DIAGNOSIS — N529 Male erectile dysfunction, unspecified: Secondary | ICD-10-CM

## 2022-01-01 ENCOUNTER — Other Ambulatory Visit: Payer: Self-pay | Admitting: Internal Medicine

## 2022-01-01 DIAGNOSIS — N529 Male erectile dysfunction, unspecified: Secondary | ICD-10-CM

## 2022-03-05 ENCOUNTER — Encounter: Payer: Self-pay | Admitting: Internal Medicine

## 2022-03-05 ENCOUNTER — Ambulatory Visit (INDEPENDENT_AMBULATORY_CARE_PROVIDER_SITE_OTHER): Payer: 59 | Admitting: Internal Medicine

## 2022-03-05 VITALS — BP 154/90 | HR 82 | Temp 98.3°F | Ht 68.5 in | Wt 207.2 lb

## 2022-03-05 DIAGNOSIS — E538 Deficiency of other specified B group vitamins: Secondary | ICD-10-CM | POA: Diagnosis not present

## 2022-03-05 DIAGNOSIS — Z23 Encounter for immunization: Secondary | ICD-10-CM | POA: Diagnosis not present

## 2022-03-05 DIAGNOSIS — Z1159 Encounter for screening for other viral diseases: Secondary | ICD-10-CM

## 2022-03-05 DIAGNOSIS — Z114 Encounter for screening for human immunodeficiency virus [HIV]: Secondary | ICD-10-CM

## 2022-03-05 DIAGNOSIS — Z125 Encounter for screening for malignant neoplasm of prostate: Secondary | ICD-10-CM

## 2022-03-05 DIAGNOSIS — Z0000 Encounter for general adult medical examination without abnormal findings: Secondary | ICD-10-CM | POA: Diagnosis not present

## 2022-03-05 DIAGNOSIS — I10 Essential (primary) hypertension: Secondary | ICD-10-CM

## 2022-03-05 DIAGNOSIS — N529 Male erectile dysfunction, unspecified: Secondary | ICD-10-CM

## 2022-03-05 DIAGNOSIS — R7302 Impaired glucose tolerance (oral): Secondary | ICD-10-CM

## 2022-03-05 DIAGNOSIS — E559 Vitamin D deficiency, unspecified: Secondary | ICD-10-CM | POA: Diagnosis not present

## 2022-03-05 LAB — COMPREHENSIVE METABOLIC PANEL
ALT: 10 U/L (ref 0–53)
AST: 9 U/L (ref 0–37)
Albumin: 4.5 g/dL (ref 3.5–5.2)
Alkaline Phosphatase: 36 U/L — ABNORMAL LOW (ref 39–117)
BUN: 8 mg/dL (ref 6–23)
CO2: 31 mEq/L (ref 19–32)
Calcium: 9.4 mg/dL (ref 8.4–10.5)
Chloride: 103 mEq/L (ref 96–112)
Creatinine, Ser: 1.14 mg/dL (ref 0.40–1.50)
GFR: 72.45 mL/min (ref >60.00)
Glucose, Bld: 97 mg/dL (ref 70–99)
Potassium: 4.2 mEq/L (ref 3.5–5.1)
Sodium: 141 mEq/L (ref 135–145)
Total Bilirubin: 0.6 mg/dL (ref 0.2–1.2)
Total Protein: 7 g/dL (ref 6.0–8.3)

## 2022-03-05 LAB — CBC WITH DIFFERENTIAL/PLATELET
Basophils Absolute: 0 10*3/uL (ref 0.0–0.1)
Basophils Relative: 0.3 % (ref 0.0–3.0)
Eosinophils Absolute: 0.2 10*3/uL (ref 0.0–0.7)
Eosinophils Relative: 2.8 % (ref 0.0–5.0)
HCT: 46.6 % (ref 39.0–52.0)
Hemoglobin: 15.9 g/dL (ref 13.0–17.0)
Lymphocytes Relative: 31.3 % (ref 12.0–46.0)
Lymphs Abs: 1.9 10*3/uL (ref 0.7–4.0)
MCHC: 34.1 g/dL (ref 30.0–36.0)
MCV: 86.3 fl (ref 78.0–100.0)
Monocytes Absolute: 0.5 10*3/uL (ref 0.1–1.0)
Monocytes Relative: 7.7 % (ref 3.0–12.0)
Neutro Abs: 3.5 10*3/uL (ref 1.4–7.7)
Neutrophils Relative %: 57.9 % (ref 43.0–77.0)
Platelets: 191 10*3/uL (ref 150.0–400.0)
RBC: 5.39 Mil/uL (ref 4.22–5.81)
RDW: 14.2 % (ref 11.5–15.5)
WBC: 6.1 10*3/uL (ref 4.0–10.5)

## 2022-03-05 LAB — LIPID PANEL
Cholesterol: 166 mg/dL (ref 0–200)
HDL: 37.9 mg/dL — ABNORMAL LOW (ref >39.00)
LDL Cholesterol: 109 mg/dL — ABNORMAL HIGH (ref 0–99)
NonHDL: 128
Total CHOL/HDL Ratio: 4
Triglycerides: 97 mg/dL (ref 0.0–149.0)
VLDL: 19.4 mg/dL (ref 0.0–40.0)

## 2022-03-05 LAB — VITAMIN B12: Vitamin B-12: 245 pg/mL (ref 211–911)

## 2022-03-05 LAB — PSA: PSA: 0.43 ng/mL (ref 0.10–4.00)

## 2022-03-05 LAB — HEMOGLOBIN A1C: Hgb A1c MFr Bld: 5.9 % (ref 4.6–6.5)

## 2022-03-05 LAB — VITAMIN D 25 HYDROXY (VIT D DEFICIENCY, FRACTURES): VITD: 25.28 ng/mL — ABNORMAL LOW (ref 30.00–100.00)

## 2022-03-05 MED ORDER — AMLODIPINE BESYLATE 5 MG PO TABS: 5.0000 mg | ORAL_TABLET | Freq: Every day | ORAL | 1 refills | Status: DC

## 2022-03-05 NOTE — Progress Notes (Signed)
Established Patient Office Visit     CC/Reason for Visit: Annual preventive exam, follow-up chronic medical conditions  HPI: Willie Ferguson is a 56 y.o. male who is coming in today for the above mentioned reasons. Past Medical History is significant for: Hypertension, impaired glucose tolerance, vitamin D and B12 deficiencies, erectile dysfunction.  He has not been taking his losartan as it causes a headache.  He has routine eye and dental care, he exercises at the gym 3 days a week.  He is due for COVID, flu, Tdap vaccines.  He had a colonoscopy in 2017.  He is feeling well and has no acute concerns or complaints.   Past Medical/Surgical History: Past Medical History:  Diagnosis Date   Snoring     No past surgical history on file.  Social History:  reports that he has never smoked. He has never used smokeless tobacco. He reports that he does not drink alcohol and does not use drugs.  Allergies: No Known Allergies  Family History:  Family History  Problem Relation Age of Onset   Hypertension Mother    Hypertension Brother    Hypertension Brother      Current Outpatient Medications:    amLODipine (NORVASC) 5 MG tablet, Take 1 tablet (5 mg total) by mouth daily., Disp: 90 tablet, Rfl: 1   cyanocobalamin (,VITAMIN B-12,) 1000 MCG/ML injection, Inject 1 mL (1,000 mcg total) into the muscle every 30 (thirty) days., Disp: 1 mL, Rfl: 6   cyclobenzaprine (FLEXERIL) 5 MG tablet, Take 1 tablet (5 mg total) by mouth at bedtime as needed for muscle spasms., Disp: 30 tablet, Rfl: 1   sildenafil (VIAGRA) 100 MG tablet, TAKE 1/2 (ONE-HALF) TO ONE TABLET BY MOUTH ONCE DAILY AS NEEDED FOR  ERECTILE  DYSFUNCTION, Disp: 10 tablet, Rfl: 0   SYRINGE-NEEDLE, DISP, 3 ML (BD ECLIPSE SYRINGE) 25G X 1" 3 ML MISC, 1 each by Does not apply route as directed., Disp: 1 each, Rfl: 7  Review of Systems:  Constitutional: Denies fever, chills, diaphoresis, appetite change and fatigue.  HEENT:  Denies photophobia, eye pain, redness, hearing loss, ear pain, congestion, sore throat, rhinorrhea, sneezing, mouth sores, trouble swallowing, neck pain, neck stiffness and tinnitus.   Respiratory: Denies SOB, DOE, cough, chest tightness,  and wheezing.   Cardiovascular: Denies chest pain, palpitations and leg swelling.  Gastrointestinal: Denies nausea, vomiting, abdominal pain, diarrhea, constipation, blood in stool and abdominal distention.  Genitourinary: Denies dysuria, urgency, frequency, hematuria, flank pain and difficulty urinating.  Endocrine: Denies: hot or cold intolerance, sweats, changes in hair or nails, polyuria, polydipsia. Musculoskeletal: Denies myalgias, back pain, joint swelling, arthralgias and gait problem.  Skin: Denies pallor, rash and wound.  Neurological: Denies dizziness, seizures, syncope, weakness, light-headedness, numbness and headaches.  Hematological: Denies adenopathy. Easy bruising, personal or family bleeding history  Psychiatric/Behavioral: Denies suicidal ideation, mood changes, confusion, nervousness, sleep disturbance and agitation    Physical Exam: Vitals:   03/05/22 0705 03/05/22 0709  BP: (!) 150/100 (!) 154/90  Pulse: 82   Temp: 98.3 F (36.8 C)   TempSrc: Oral   SpO2: 98%   Weight: 207 lb 3.2 oz (94 kg)   Height: 5' 8.5" (1.74 m)     Body mass index is 31.05 kg/m.   Constitutional: NAD, calm, comfortable Eyes: PERRL, lids and conjunctivae normal, wears corrective lenses ENMT: Mucous membranes are moist. Posterior pharynx clear of any exudate or lesions. Normal dentition. Tympanic membrane is pearly white, no erythema or bulging.  Neck: normal, supple, no masses, no thyromegaly Respiratory: clear to auscultation bilaterally, no wheezing, no crackles. Normal respiratory effort. No accessory muscle use.  Cardiovascular: Regular rate and rhythm, no murmurs / rubs / gallops. No extremity edema. 2+ pedal pulses. No carotid bruits.  Abdomen:  no tenderness, no masses palpated. No hepatosplenomegaly. Bowel sounds positive.  Musculoskeletal: no clubbing / cyanosis. No joint deformity upper and lower extremities. Good ROM, no contractures. Normal muscle tone.  Skin: no rashes, lesions, ulcers. No induration Neurologic: CN 2-12 grossly intact. Sensation intact, DTR normal. Strength 5/5 in all 4.  Psychiatric: Normal judgment and insight. Alert and oriented x 3. Normal mood.   Flowsheet Row Office Visit from 03/05/2022 in [facility] [place]  PHQ-9 Total Score 3         Impression and Plan:  Encounter for preventive health examination - Plan: PSA  Need for Tdap vaccination  Primary hypertension - Plan: CBC with Differential/Platelet, Comprehensive metabolic panel, Lipid panel, amLODipine (NORVASC) 5 MG tablet  Vitamin D deficiency - Plan: VITAMIN D 25 Hydroxy (Vit-D Deficiency, Fractures)  IGT (impaired glucose tolerance) - Plan: Hemoglobin A1c  Vitamin B12 deficiency - Plan: Vitamin B12  Erectile dysfunction, unspecified erectile dysfunction type  Encounter for screening for HIV - Plan: HIV antibody (with reflex)  Encounter for hepatitis C screening test for low risk patient - Plan: Hep C Antibody    -Recommend routine eye and dental care. -Immunizations: Tdap in office today, has declined flu and COVID despite counseling -Healthy lifestyle discussed in detail. -Labs to be updated today. -Colon cancer screening: 04/2015, 10-year follow-up -Breast cancer screening: Not applicable -Cervical cancer screening: Not applicable -Lung cancer screening: Not applicable -Prostate cancer screening: PSA today -DEXA: Not applicable  -Blood pressure remains elevated today despite 2 consecutive measurements. -He admits to not taking losartan as it causes a headache.  I think his headache is more likely related to elevated pressure, in particular elevated diastolic pressure. -In any case, I will discontinue  losartan and place him on amlodipine 5 mg.  I have asked him to do ambulatory blood pressure measurements and he will return in 3 months for follow-up.    Patient Instructions  -Nice seeing you today!!  -Lab work today; will notify you once results are available.  -Tdap vaccine today. Remember your flu and COVID vaccines.  -STOP losartan  -START amlodipine 5 mg daily.   -Schedule follow up in 3 months.      Chaya Jan, MD [facility] at [facility]

## 2022-03-05 NOTE — Patient Instructions (Signed)
-  Nice seeing you today!!  -Lab work today; will notify you once results are available.  -Tdap vaccine today. Remember your flu and COVID vaccines.  -STOP losartan  -START amlodipine 5 mg daily.   -Schedule follow up in 3 months.

## 2022-03-05 NOTE — Addendum Note (Signed)
Addended by: Kern Reap B on: 03/05/2022 07:51 AM   Modules accepted: Orders

## 2022-03-06 LAB — HIV ANTIBODY (ROUTINE TESTING W REFLEX): HIV 1&2 Ab, 4th Generation: NONREACTIVE

## 2022-03-06 LAB — HEPATITIS C ANTIBODY: Hepatitis C Ab: NONREACTIVE

## 2022-03-10 ENCOUNTER — Other Ambulatory Visit: Payer: Self-pay | Admitting: Internal Medicine

## 2022-03-10 DIAGNOSIS — E559 Vitamin D deficiency, unspecified: Secondary | ICD-10-CM

## 2022-03-10 MED ORDER — VITAMIN D (ERGOCALCIFEROL) 1.25 MG (50000 UNIT) PO CAPS: 50000.0000 [IU] | ORAL_CAPSULE | ORAL | 0 refills | Status: AC

## 2022-03-11 ENCOUNTER — Other Ambulatory Visit: Payer: Self-pay | Admitting: *Deleted

## 2022-03-11 DIAGNOSIS — E559 Vitamin D deficiency, unspecified: Secondary | ICD-10-CM

## 2022-03-11 DIAGNOSIS — E538 Deficiency of other specified B group vitamins: Secondary | ICD-10-CM

## 2022-03-11 MED ORDER — "BD ECLIPSE SYRINGE 25G X 1"" 3 ML MISC": 1.0000 | 7 refills | Status: DC

## 2022-03-11 MED ORDER — CYANOCOBALAMIN 1000 MCG/ML IJ SOLN: 1000.0000 ug | INTRAMUSCULAR | 11 refills | Status: DC

## 2022-04-16 ENCOUNTER — Other Ambulatory Visit: Payer: Self-pay | Admitting: Internal Medicine

## 2022-04-16 DIAGNOSIS — N529 Male erectile dysfunction, unspecified: Secondary | ICD-10-CM

## 2022-06-04 ENCOUNTER — Encounter: Payer: Self-pay | Admitting: Internal Medicine

## 2022-06-04 ENCOUNTER — Ambulatory Visit: Payer: 59 | Admitting: Internal Medicine

## 2022-06-04 VITALS — BP 149/88 | HR 77 | Temp 97.9°F | Wt 211.3 lb

## 2022-06-04 DIAGNOSIS — I10 Essential (primary) hypertension: Secondary | ICD-10-CM | POA: Diagnosis not present

## 2022-06-04 DIAGNOSIS — E538 Deficiency of other specified B group vitamins: Secondary | ICD-10-CM | POA: Diagnosis not present

## 2022-06-04 DIAGNOSIS — R7302 Impaired glucose tolerance (oral): Secondary | ICD-10-CM

## 2022-06-04 LAB — POCT GLYCOSYLATED HEMOGLOBIN (HGB A1C): Hemoglobin A1C: 5.7 % — AB (ref 4.0–5.6)

## 2022-06-04 MED ORDER — "BD ECLIPSE SYRINGE 25G X 1"" 3 ML MISC": 1.0000 | 7 refills | Status: AC

## 2022-06-04 MED ORDER — VALSARTAN-HYDROCHLOROTHIAZIDE 160-25 MG PO TABS: 1.0000 | ORAL_TABLET | Freq: Every day | ORAL | 1 refills | Status: DC

## 2022-06-04 MED ORDER — CYANOCOBALAMIN 1000 MCG/ML IJ SOLN: 1000.0000 ug | INTRAMUSCULAR | 11 refills | Status: DC

## 2022-06-04 NOTE — Addendum Note (Signed)
Addended by: Erline Hau on: 06/04/2022 01:34 PM   Modules accepted: Level of Service

## 2022-06-04 NOTE — Progress Notes (Signed)
Established Patient Office Visit     CC/Reason for Visit: Follow-up chronic conditions  HPI: Willie Ferguson is a 57 y.o. male who is coming in today for the above mentioned reasons. Past Medical History is significant for: Hypertension, impaired glucose tolerance, vitamin D and B12 deficiency, erectile dysfunction.  He has been feeling well.  He has been keeping track of his ambulatory blood pressure measurements, they range in the systolic of high [id number] to [id number] with diastolics in the mid [id number] range.     Past Medical/Surgical History: Past Medical History:  Diagnosis Date   Snoring     No past surgical history on file.  Social History:  reports that he has never smoked. He has never used smokeless tobacco. He reports that he does not drink alcohol and does not use drugs.  Allergies: No Known Allergies  Family History:  Family History  Problem Relation Age of Onset   Hypertension Mother    Hypertension Brother    Hypertension Brother      Current Outpatient Medications:    amLODipine (NORVASC) 5 MG tablet, Take 1 tablet (5 mg total) by mouth daily., Disp: 90 tablet, Rfl: 1   cyclobenzaprine (FLEXERIL) 5 MG tablet, Take 1 tablet (5 mg total) by mouth at bedtime as needed for muscle spasms., Disp: 30 tablet, Rfl: 1   sildenafil (VIAGRA) 100 MG tablet, TAKE 1/2 (ONE-HALF) TO ONE TABLET BY MOUTH ONCE DAILY AS NEEDED FOR  ERECTILE  DYSFUNCTION, Disp: 10 tablet, Rfl: 0   valsartan-hydrochlorothiazide (DIOVAN-HCT) 160-25 MG tablet, Take 1 tablet by mouth daily., Disp: 90 tablet, Rfl: 1   cyanocobalamin (VITAMIN B12) 1000 MCG/ML injection, Inject 1 mL (1,000 mcg total) into the muscle every 30 (thirty) days., Disp: 6 mL, Rfl: 11   SYRINGE-NEEDLE, DISP, 3 ML (BD ECLIPSE SYRINGE) 25G X 1" 3 ML MISC, 1 each by Does not apply route as directed., Disp: 100 each, Rfl: 7  Review of Systems:  Negative unless indicated in HPI.   Physical Exam: Vitals:   06/04/22 0909 06/04/22  0910  BP: (!) 145/82 (!) 149/88  Pulse: 77   Temp: 97.9 F (36.6 C)   TempSrc: Oral   SpO2: 98%   Weight: 211 lb 4.8 oz (95.8 kg)     Body mass index is 31.66 kg/m.   Physical Exam Vitals reviewed.  Constitutional:      Appearance: Normal appearance.  HENT:     Head: Normocephalic and atraumatic.  Eyes:     Conjunctiva/sclera: Conjunctivae normal.     Pupils: Pupils are equal, round, and reactive to light.  Cardiovascular:     Rate and Rhythm: Normal rate and regular rhythm.  Pulmonary:     Effort: Pulmonary effort is normal.     Breath sounds: Normal breath sounds.  Skin:    General: Skin is warm and dry.  Neurological:     General: No focal deficit present.     Mental Status: He is alert and oriented to person, place, and time.  Psychiatric:        Mood and Affect: Mood normal.        Behavior: Behavior normal.        Thought Content: Thought content normal.        Judgment: Judgment normal.      Impression and Plan:  IGT (impaired glucose tolerance) - Plan: POC HgB A1c  Primary hypertension - Plan: valsartan-hydrochlorothiazide (DIOVAN-HCT) 160-25 MG tablet  Vitamin B12 deficiency - Plan: SYRINGE-NEEDLE,  DISP, 3 ML (BD ECLIPSE SYRINGE) 25G X 1" 3 ML MISC, cyanocobalamin (VITAMIN B12) 1000 MCG/ML injection  -A1c of 5.7 remains in the prediabetic range, continue to follow and encourage lifestyle changes. -Blood pressure remains elevated above goal both in office and by home measurements.  Continue amlodipine 5 mg, add Diovan HCT 160/25 mg, follow-up in 3 months.  He will continue ambulatory measurements. -Vitamin B12 prescribed.  Time spent:32 minutes reviewing chart, interviewing and examining patient and formulating plan of care.     Lelon Frohlich, MD [facility] at [facility]

## 2022-06-04 NOTE — Patient Instructions (Signed)
-  Nice seeing you today!!  -Continue amlodipine.  -Start valsartan HCT 160/25 mg daily.  -Schedule follow up in 3 months.

## 2022-09-03 ENCOUNTER — Ambulatory Visit: Payer: 59 | Admitting: Internal Medicine

## 2022-09-10 ENCOUNTER — Encounter: Payer: Self-pay | Admitting: Internal Medicine

## 2022-09-10 ENCOUNTER — Ambulatory Visit: Payer: 59 | Admitting: Internal Medicine

## 2022-09-10 VITALS — BP 107/63 | HR 79 | Temp 97.9°F | Wt 200.4 lb

## 2022-09-10 DIAGNOSIS — I10 Essential (primary) hypertension: Secondary | ICD-10-CM | POA: Diagnosis not present

## 2022-09-10 DIAGNOSIS — E538 Deficiency of other specified B group vitamins: Secondary | ICD-10-CM

## 2022-09-10 DIAGNOSIS — E559 Vitamin D deficiency, unspecified: Secondary | ICD-10-CM

## 2022-09-10 DIAGNOSIS — N529 Male erectile dysfunction, unspecified: Secondary | ICD-10-CM | POA: Diagnosis not present

## 2022-09-10 DIAGNOSIS — R7302 Impaired glucose tolerance (oral): Secondary | ICD-10-CM

## 2022-09-10 DIAGNOSIS — G5622 Lesion of ulnar nerve, left upper limb: Secondary | ICD-10-CM

## 2022-09-10 LAB — POCT GLYCOSYLATED HEMOGLOBIN (HGB A1C): Hemoglobin A1C: 5.8 % — AB (ref 4.0–5.6)

## 2022-09-10 MED ORDER — MELOXICAM 7.5 MG PO TABS: 7.5000 mg | ORAL_TABLET | Freq: Every day | ORAL | 0 refills | Status: DC

## 2022-09-10 MED ORDER — SILDENAFIL CITRATE 50 MG PO TABS: 50.0000 mg | ORAL_TABLET | Freq: Every day | ORAL | 1 refills | Status: DC

## 2022-09-10 NOTE — Progress Notes (Signed)
Established Patient Office Visit     CC/Reason for Visit: Follow-up chronic conditions and discuss acute concerns  HPI: Willie Ferguson is a 57 y.o. male who is coming in today for the above mentioned reasons. Past Medical History is significant for: Hypertension, impaired glucose tolerance, erectile dysfunction as well as vitamin D and B12 deficiencies.  He has been taking sildenafil 100 mg as needed.  It is not working.  He has never tried daily dosing.  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  He uses a joystick in his left hand and his elbow is propped up on a hard ledge.  He has been experiencing tingling in his left fourth and fifth fingers.   Past Medical/Surgical History: Past Medical History:  Diagnosis Date   Snoring     No past surgical history on file.  Social History:  reports that he has never smoked. He has never used smokeless tobacco. He reports that he does not drink alcohol and does not use drugs.  Allergies: No Known Allergies  Family History:  Family History  Problem Relation Age of Onset   Hypertension Mother    Hypertension Brother    Hypertension Brother      Current Outpatient Medications:    amLODipine (NORVASC) 5 MG tablet, Take 1 tablet (5 mg total) by mouth daily., Disp: 90 tablet, Rfl: 1   cyanocobalamin (VITAMIN B12) 1000 MCG/ML injection, Inject 1 mL (1,000 mcg total) into the muscle every 30 (thirty) days., Disp: 6 mL, Rfl: 11   cyclobenzaprine (FLEXERIL) 5 MG tablet, Take 1 tablet (5 mg total) by mouth at bedtime as needed for muscle spasms., Disp: 30 tablet, Rfl: 1   meloxicam (MOBIC) 7.5 MG tablet, Take 1 tablet (7.5 mg total) by mouth daily., Disp: 30 tablet, Rfl: 0   SYRINGE-NEEDLE, DISP, 3 ML (BD ECLIPSE SYRINGE) 25G X 1" 3 ML MISC, 1 each by Does not apply route as directed., Disp: 100 each, Rfl: 7   valsartan-hydrochlorothiazide (DIOVAN-HCT) 160-25 MG tablet, Take 1 tablet by mouth daily., Disp: 90 tablet, Rfl: 1   sildenafil  (VIAGRA) 50 MG tablet, Take 1 tablet (50 mg total) by mouth daily., Disp: 90 tablet, Rfl: 1  Review of Systems:  Negative unless indicated in HPI.   Physical Exam: Vitals:   09/10/22 0959  BP: 107/63  Pulse: 79  Temp: 97.9 F (36.6 C)  TempSrc: Oral  SpO2: 98%  Weight: 200 lb 6.4 oz (90.9 kg)    Body mass index is 30.03 kg/m.   Physical Exam Vitals reviewed.  Constitutional:      Appearance: Normal appearance.  HENT:     Head: Normocephalic and atraumatic.  Eyes:     Conjunctiva/sclera: Conjunctivae normal.     Pupils: Pupils are equal, round, and reactive to light.  Cardiovascular:     Rate and Rhythm: Normal rate and regular rhythm.  Pulmonary:     Effort: Pulmonary effort is normal.     Breath sounds: Normal breath sounds.  Skin:    General: Skin is warm and dry.  Neurological:     General: No focal deficit present.     Mental Status: He is alert and oriented to person, place, and time.  Psychiatric:        Mood and Affect: Mood normal.        Behavior: Behavior normal.        Thought Content: Thought content normal.        Judgment: Judgment normal.  Impression and Plan:  IGT (impaired glucose tolerance) Assessment & Plan: Stable. A1c is 5.8 today. Continue to work on lifestyle changes.  Orders: -     POCT glycosylated hemoglobin (Hb A1C)  Primary hypertension Assessment & Plan: Well controlled on current meds.   Erectile dysfunction, unspecified erectile dysfunction type Assessment & Plan: Will try taking sildenafil daily. If not better, will consider GU referral.  Orders: -     Sildenafil Citrate; Take 1 tablet (50 mg total) by mouth daily.  Dispense: 90 tablet; Refill: 1  Vitamin B12 deficiency  Vitamin D deficiency  Ulnar neuropathy at elbow, left -     Meloxicam; Take 1 tablet (7.5 mg total) by mouth daily.  Dispense: 30 tablet; Refill: 0   -Suspect tingling left 4th and 5th fingers 2/2 ulnar neuropathy due to his job. Advised  a course of meloxicam and a padded elbow brace.  Time spent:34 minutes reviewing chart, interviewing and examining patient and formulating plan of care.     Chaya Jan, MD [facility] at [facility]

## 2022-09-10 NOTE — Assessment & Plan Note (Signed)
Well-controlled on current meds 

## 2022-09-10 NOTE — Assessment & Plan Note (Signed)
Stable. A1c is 5.8 today. Continue to work on lifestyle changes.

## 2022-09-10 NOTE — Assessment & Plan Note (Signed)
Will try taking sildenafil daily. If not better, will consider GU referral.

## 2022-10-22 ENCOUNTER — Other Ambulatory Visit: Payer: Self-pay | Admitting: Internal Medicine

## 2022-10-22 DIAGNOSIS — G5622 Lesion of ulnar nerve, left upper limb: Secondary | ICD-10-CM

## 2022-10-29 ENCOUNTER — Telehealth: Payer: Self-pay | Admitting: Internal Medicine

## 2022-10-29 DIAGNOSIS — N529 Male erectile dysfunction, unspecified: Secondary | ICD-10-CM

## 2022-10-29 NOTE — Telephone Encounter (Signed)
Referral was placed 

## 2022-10-29 NOTE — Telephone Encounter (Signed)
Pt called requesting to speak to CMA.   Pt was asked if it was regarding a medication refill, as I could help with that. Pt stated it was for something else and would rather speak directly to CMA.   Pt asked that CMA call back at their earliest convenience. 

## 2022-10-29 NOTE — Telephone Encounter (Signed)
Patient states that the medication for ED is no longer working.  He is requesting a referral to urology.  Okay to place referral?

## 2022-11-06 ENCOUNTER — Encounter: Payer: Self-pay | Admitting: Internal Medicine

## 2022-11-06 NOTE — Telephone Encounter (Signed)
error 

## 2022-12-05 ENCOUNTER — Telehealth: Payer: Self-pay | Admitting: Internal Medicine

## 2022-12-05 DIAGNOSIS — I10 Essential (primary) hypertension: Secondary | ICD-10-CM

## 2022-12-05 MED ORDER — VALSARTAN-HYDROCHLOROTHIAZIDE 160-25 MG PO TABS
1.0000 | ORAL_TABLET | Freq: Every day | ORAL | 0 refills | Status: DC
Start: 2022-12-05 — End: 2022-12-17

## 2022-12-05 NOTE — Telephone Encounter (Signed)
Willie Ferguson filled prescription for a 15 day supply--pt make an appt to be seen for 3 mth f/u as directed by Ardyth Harps.  Patient has an appointment for 12/10/22 at 8:30 am.

## 2022-12-05 NOTE — Telephone Encounter (Signed)
Patient needs a refill on Valsartan-Hydrochlorothiazide.  He is completely out.    Pharmacy- [facility] on [address]

## 2022-12-10 ENCOUNTER — Ambulatory Visit: Payer: 59 | Admitting: Internal Medicine

## 2022-12-17 ENCOUNTER — Ambulatory Visit: Payer: 59 | Admitting: Internal Medicine

## 2022-12-17 ENCOUNTER — Encounter: Payer: Self-pay | Admitting: Internal Medicine

## 2022-12-17 VITALS — BP 120/84 | HR 80 | Temp 98.0°F | Wt 207.8 lb

## 2022-12-17 DIAGNOSIS — E538 Deficiency of other specified B group vitamins: Secondary | ICD-10-CM | POA: Diagnosis not present

## 2022-12-17 DIAGNOSIS — I10 Essential (primary) hypertension: Secondary | ICD-10-CM | POA: Diagnosis not present

## 2022-12-17 DIAGNOSIS — E559 Vitamin D deficiency, unspecified: Secondary | ICD-10-CM

## 2022-12-17 DIAGNOSIS — R7302 Impaired glucose tolerance (oral): Secondary | ICD-10-CM

## 2022-12-17 DIAGNOSIS — N529 Male erectile dysfunction, unspecified: Secondary | ICD-10-CM

## 2022-12-17 LAB — POCT GLYCOSYLATED HEMOGLOBIN (HGB A1C): Hemoglobin A1C: 5.7 % — AB (ref 4.0–5.6)

## 2022-12-17 MED ORDER — CYANOCOBALAMIN 1000 MCG/ML IJ SOLN: 1000.0000 ug | INTRAMUSCULAR | 11 refills | Status: AC

## 2022-12-17 MED ORDER — VALSARTAN-HYDROCHLOROTHIAZIDE 160-25 MG PO TABS
1.0000 | ORAL_TABLET | Freq: Every day | ORAL | 1 refills | Status: DC
Start: 2022-12-17 — End: 2023-05-19

## 2022-12-17 MED ORDER — AMLODIPINE BESYLATE 5 MG PO TABS
5.0000 mg | ORAL_TABLET | Freq: Every day | ORAL | 1 refills | Status: AC
Start: 2022-12-17 — End: ?

## 2022-12-17 NOTE — Addendum Note (Signed)
Addended by: Kern Reap B on: 12/17/2022 09:31 AM   Modules accepted: Orders

## 2022-12-17 NOTE — Assessment & Plan Note (Signed)
A1c demonstrates stability at 5.  7.  Continue to work on lifestyle changes.

## 2022-12-17 NOTE — Assessment & Plan Note (Signed)
Well controlled on current

## 2022-12-17 NOTE — Assessment & Plan Note (Signed)
Check levels with next CPE.

## 2022-12-17 NOTE — Assessment & Plan Note (Signed)
Still having issues despite sildenafil prescription.  Requesting urology referral.

## 2022-12-17 NOTE — Progress Notes (Signed)
Established Patient Office Visit     CC/Reason for Visit: Follow-up chronic conditions  HPI: Willie Ferguson is a 57 y.o. male who is coming in today for the above mentioned reasons. Past Medical History is significant for: Hypertension, impaired glucose tolerance, vitamin D and B12 deficiencies and erectile dysfunction.  He is still having issues with ED despite sildenafil, is requesting urology referral.  Otherwise has been doing well without acute issues or complaints.  Offered flu vaccination today but declines despite counseling.   Past Medical/Surgical History: Past Medical History:  Diagnosis Date   Snoring     No past surgical history on file.  Social History:  reports that he has never smoked. He has never used smokeless tobacco. He reports that he does not drink alcohol and does not use drugs.  Allergies: No Known Allergies  Family History:  Family History  Problem Relation Age of Onset   Hypertension Mother    Hypertension Brother    Hypertension Brother      Current Outpatient Medications:    amLODipine (NORVASC) 5 MG tablet, Take 1 tablet (5 mg total) by mouth daily., Disp: 90 tablet, Rfl: 1   cyanocobalamin (VITAMIN B12) 1000 MCG/ML injection, Inject 1 mL (1,000 mcg total) into the muscle every 30 (thirty) days., Disp: 6 mL, Rfl: 11   cyclobenzaprine (FLEXERIL) 5 MG tablet, Take 1 tablet (5 mg total) by mouth at bedtime as needed for muscle spasms., Disp: 30 tablet, Rfl: 1   meloxicam (MOBIC) 7.5 MG tablet, Take 1 tablet by mouth once daily, Disp: 30 tablet, Rfl: 0   sildenafil (VIAGRA) 50 MG tablet, Take 1 tablet (50 mg total) by mouth daily., Disp: 90 tablet, Rfl: 1   SYRINGE-NEEDLE, DISP, 3 ML (BD ECLIPSE SYRINGE) 25G X 1" 3 ML MISC, 1 each by Does not apply route as directed., Disp: 100 each, Rfl: 7   valsartan-hydrochlorothiazide (DIOVAN-HCT) 160-25 MG tablet, Take 1 tablet by mouth daily., Disp: 15 tablet, Rfl: 0  Review of Systems:  Negative  unless indicated in HPI.   Physical Exam: Vitals:   12/17/22 0759  BP: 120/84  Pulse: 80  Temp: 98 F (36.7 C)  TempSrc: Oral  SpO2: 97%  Weight: 207 lb 12.8 oz (94.3 kg)    Body mass index is 31.14 kg/m.   Physical Exam Vitals reviewed.  Constitutional:      Appearance: Normal appearance.  HENT:     Head: Normocephalic and atraumatic.  Eyes:     Conjunctiva/sclera: Conjunctivae normal.     Pupils: Pupils are equal, round, and reactive to light.  Cardiovascular:     Rate and Rhythm: Normal rate and regular rhythm.  Pulmonary:     Effort: Pulmonary effort is normal.     Breath sounds: Normal breath sounds.  Skin:    General: Skin is warm and dry.  Neurological:     General: No focal deficit present.     Mental Status: He is alert and oriented to person, place, and time.  Psychiatric:        Mood and Affect: Mood normal.        Behavior: Behavior normal.        Thought Content: Thought content normal.        Judgment: Judgment normal.      Impression and Plan:  IGT (impaired glucose tolerance) Assessment & Plan: A1c demonstrates stability at 5.  7.  Continue to work on lifestyle changes.  Orders: -  POCT glycosylated hemoglobin (Hb A1C)  Primary hypertension Assessment & Plan: Well-controlled on current.   Vitamin D deficiency Assessment & Plan: Check levels with next CPE.   Vitamin B12 deficiency Assessment & Plan: Check levels with next CPE.   Erectile dysfunction, unspecified erectile dysfunction type Assessment & Plan: Still having issues despite sildenafil prescription.  Requesting urology referral.  Orders: -     Ambulatory referral to Urology     Time spent:32 minutes reviewing chart, interviewing and examining patient and formulating plan of care.     Chaya Jan, MD [facility] at [facility]

## 2023-04-22 ENCOUNTER — Encounter: Payer: Self-pay | Admitting: Internal Medicine

## 2023-04-22 ENCOUNTER — Ambulatory Visit (INDEPENDENT_AMBULATORY_CARE_PROVIDER_SITE_OTHER): Payer: 59 | Admitting: Internal Medicine

## 2023-04-22 VITALS — BP 143/85 | HR 73 | Temp 98.0°F | Ht 69.0 in | Wt 209.2 lb

## 2023-04-22 DIAGNOSIS — E559 Vitamin D deficiency, unspecified: Secondary | ICD-10-CM | POA: Diagnosis not present

## 2023-04-22 DIAGNOSIS — Z0000 Encounter for general adult medical examination without abnormal findings: Secondary | ICD-10-CM

## 2023-04-22 DIAGNOSIS — I10 Essential (primary) hypertension: Secondary | ICD-10-CM | POA: Diagnosis not present

## 2023-04-22 DIAGNOSIS — E538 Deficiency of other specified B group vitamins: Secondary | ICD-10-CM | POA: Diagnosis not present

## 2023-04-22 DIAGNOSIS — R7302 Impaired glucose tolerance (oral): Secondary | ICD-10-CM

## 2023-04-22 DIAGNOSIS — N529 Male erectile dysfunction, unspecified: Secondary | ICD-10-CM

## 2023-04-22 DIAGNOSIS — Z23 Encounter for immunization: Secondary | ICD-10-CM

## 2023-04-22 LAB — LIPID PANEL
Cholesterol: 174 mg/dL (ref 0–200)
HDL: 41 mg/dL (ref >39.00)
LDL Cholesterol: 117 mg/dL — ABNORMAL HIGH (ref 0–99)
NonHDL: 133.36
Total CHOL/HDL Ratio: 4
Triglycerides: 80 mg/dL (ref 0.0–149.0)
VLDL: 16 mg/dL (ref 0.0–40.0)

## 2023-04-22 LAB — VITAMIN B12: Vitamin B-12: 294 pg/mL (ref 211–911)

## 2023-04-22 LAB — COMPREHENSIVE METABOLIC PANEL
ALT: 11 U/L (ref 0–53)
AST: 14 U/L (ref 0–37)
Albumin: 4.7 g/dL (ref 3.5–5.2)
Alkaline Phosphatase: 38 U/L — ABNORMAL LOW (ref 39–117)
BUN: 8 mg/dL (ref 6–23)
CO2: 29 meq/L (ref 19–32)
Calcium: 9.6 mg/dL (ref 8.4–10.5)
Chloride: 104 meq/L (ref 96–112)
Creatinine, Ser: 1.06 mg/dL (ref 0.40–1.50)
GFR: 78.43 mL/min (ref >60.00)
Glucose, Bld: 95 mg/dL (ref 70–99)
Potassium: 3.8 meq/L (ref 3.5–5.1)
Sodium: 141 meq/L (ref 135–145)
Total Bilirubin: 0.6 mg/dL (ref 0.2–1.2)
Total Protein: 7.1 g/dL (ref 6.0–8.3)

## 2023-04-22 LAB — VITAMIN D 25 HYDROXY (VIT D DEFICIENCY, FRACTURES): VITD: 15.12 ng/mL — ABNORMAL LOW (ref 30.00–100.00)

## 2023-04-22 LAB — TSH: TSH: 1.52 u[IU]/mL (ref 0.35–5.50)

## 2023-04-22 LAB — PSA: PSA: 0.54 ng/mL (ref 0.10–4.00)

## 2023-04-22 NOTE — Progress Notes (Signed)
 Established Patient Office Visit     CC/Reason for Visit: Annual preventive exam  HPI: Willie Ferguson is a 58 y.o. male who is coming in today for the above mentioned reasons. Past Medical History is significant for: Hypertension, impaired glucose tolerance, erectile dysfunction, vitamin D  and B12 deficiencies.  He has routine eye and dental care.  He is doing well without concerns or complaints.  Due for flu and COVID vaccinations, had a colonoscopy in 2017 and is a 10-year callback.   Past Medical/Surgical History: Past Medical History:  Diagnosis Date   Snoring     History reviewed. No pertinent surgical history.  Social History:  reports that he has never smoked. He has never used smokeless tobacco. He reports that he does not drink alcohol and does not use drugs.  Allergies: No Known Allergies  Family History:  Family History  Problem Relation Age of Onset   Hypertension Mother    Hypertension Brother    Hypertension Brother      Current Outpatient Medications:    amLODipine  (NORVASC ) 5 MG tablet, Take 1 tablet (5 mg total) by mouth daily., Disp: 90 tablet, Rfl: 1   cyanocobalamin  (VITAMIN B12) 1000 MCG/ML injection, Inject 1 mL (1,000 mcg total) into the muscle every 30 (thirty) days., Disp: 6 mL, Rfl: 11   cyclobenzaprine  (FLEXERIL ) 5 MG tablet, Take 1 tablet (5 mg total) by mouth at bedtime as needed for muscle spasms., Disp: 30 tablet, Rfl: 1   meloxicam  (MOBIC ) 7.5 MG tablet, Take 1 tablet by mouth once daily, Disp: 30 tablet, Rfl: 0   sildenafil  (VIAGRA ) 50 MG tablet, Take 1 tablet (50 mg total) by mouth daily., Disp: 90 tablet, Rfl: 1   SYRINGE-NEEDLE, DISP, 3 ML (BD ECLIPSE SYRINGE) 25G X 1" 3 ML MISC, 1 each by Does not apply route as directed., Disp: 100 each, Rfl: 7   valsartan -hydrochlorothiazide  (DIOVAN -HCT) 160-25 MG tablet, Take 1 tablet by mouth daily., Disp: 90 tablet, Rfl: 1  Review of Systems:  Negative unless indicated in  HPI.   Physical Exam: Vitals:   04/22/23 0903 04/22/23 0907  BP: (!) 140/90 (!) 143/85  Pulse: 73   Temp: 98 F (36.7 C)   TempSrc: Oral   SpO2: 100%   Weight: 209 lb 3.2 oz (94.9 kg)   Height: 5\' 9"  (1.753 m)     Body mass index is 30.89 kg/m.   Physical Exam Vitals reviewed.  Constitutional:      General: He is not in acute distress.    Appearance: Normal appearance. He is not ill-appearing, toxic-appearing or diaphoretic.  HENT:     Head: Normocephalic.     Right Ear: Tympanic membrane, ear canal and external ear normal. There is no impacted cerumen.     Left Ear: Tympanic membrane, ear canal and external ear normal. There is no impacted cerumen.     Nose: Nose normal.     Mouth/Throat:     Mouth: Mucous membranes are moist.     Pharynx: Oropharynx is clear. No oropharyngeal exudate or posterior oropharyngeal erythema.  Eyes:     General: No scleral icterus.       Right eye: No discharge.        Left eye: No discharge.     Conjunctiva/sclera: Conjunctivae normal.     Pupils: Pupils are equal, round, and reactive to light.  Neck:     Vascular: No carotid bruit.  Cardiovascular:     Rate and Rhythm: Normal  rate and regular rhythm.     Pulses: Normal pulses.     Heart sounds: Normal heart sounds.  Pulmonary:     Effort: Pulmonary effort is normal. No respiratory distress.     Breath sounds: Normal breath sounds.  Abdominal:     General: Abdomen is flat. Bowel sounds are normal.     Palpations: Abdomen is soft.  Musculoskeletal:        General: Normal range of motion.     Cervical back: Normal range of motion.  Skin:    General: Skin is warm and dry.  Neurological:     General: No focal deficit present.     Mental Status: He is alert and oriented to person, place, and time. Mental status is at baseline.  Psychiatric:        Mood and Affect: Mood normal.        Behavior: Behavior normal.        Thought Content: Thought content normal.        Judgment:  Judgment normal.     Flowsheet Row Office Visit from 04/22/2023 in [facility] [place]  PHQ-9 Total Score 0       Impression and Plan:  Encounter for preventive health examination -     PSA; Future  IGT (impaired glucose tolerance) -     Hemoglobin A1c; Future  Primary hypertension -     CBC with Differential/Platelet; Future -     Comprehensive metabolic panel; Future -     Lipid panel; Future  Vitamin D  deficiency -     VITAMIN D  25 Hydroxy (Vit-D Deficiency, Fractures); Future  Vitamin B12 deficiency -     Vitamin B12; Future  Immunization due  Erectile dysfunction, unspecified erectile dysfunction type -     TSH; Future   -Recommend routine eye and dental care. -Healthy lifestyle discussed in detail. -Labs to be updated today. -Prostate cancer screening: PSA today Health Maintenance  Topic Date Due   COVID-19 Vaccine (4 - 2024-25 season) 12/07/2022   Flu Shot  07/06/2023*   Colon Cancer Screening  04/07/2025   DTaP/Tdap/Td vaccine (3 - Td or Tdap) 03/05/2032   Hepatitis C Screening  Completed   HIV Screening  Completed   Zoster (Shingles) Vaccine  Completed   HPV Vaccine  Aged Out  *Topic was postponed. The date shown is not the original due date.     -Flu vaccine in office today. -Advised COVID-vaccine at pharmacy. -Last colonoscopy in 2017, redo in 10 years.     Marguerita Shih, MD [facility] at [facility]

## 2023-04-22 NOTE — Addendum Note (Signed)
 Addended by: Nicolina Barrios B on: 04/22/2023 12:02 PM   Modules accepted: Orders

## 2023-04-23 ENCOUNTER — Other Ambulatory Visit: Payer: Self-pay | Admitting: Internal Medicine

## 2023-04-23 DIAGNOSIS — Z0000 Encounter for general adult medical examination without abnormal findings: Secondary | ICD-10-CM

## 2023-04-23 DIAGNOSIS — R7302 Impaired glucose tolerance (oral): Secondary | ICD-10-CM

## 2023-04-23 DIAGNOSIS — I10 Essential (primary) hypertension: Secondary | ICD-10-CM

## 2023-05-11 ENCOUNTER — Encounter: Payer: Self-pay | Admitting: Internal Medicine

## 2023-05-11 ENCOUNTER — Other Ambulatory Visit: Payer: Self-pay | Admitting: Internal Medicine

## 2023-05-11 DIAGNOSIS — E559 Vitamin D deficiency, unspecified: Secondary | ICD-10-CM

## 2023-05-11 MED ORDER — VITAMIN D (ERGOCALCIFEROL) 1.25 MG (50000 UNIT) PO CAPS: 50000.0000 [IU] | ORAL_CAPSULE | ORAL | 0 refills | Status: AC

## 2023-05-19 ENCOUNTER — Other Ambulatory Visit: Payer: Self-pay | Admitting: *Deleted

## 2023-05-19 DIAGNOSIS — I10 Essential (primary) hypertension: Secondary | ICD-10-CM

## 2023-05-19 MED ORDER — VALSARTAN-HYDROCHLOROTHIAZIDE 160-25 MG PO TABS: 1.0000 | ORAL_TABLET | Freq: Every day | ORAL | 1 refills | Status: AC

## 2023-07-29 ENCOUNTER — Encounter: Payer: Self-pay | Admitting: Internal Medicine

## 2023-07-29 ENCOUNTER — Ambulatory Visit: Payer: 59 | Admitting: Internal Medicine

## 2023-07-29 VITALS — BP 120/72 | HR 69 | Temp 97.7°F | Resp 16 | Ht 67.0 in | Wt 205.8 lb

## 2023-07-29 DIAGNOSIS — E559 Vitamin D deficiency, unspecified: Secondary | ICD-10-CM | POA: Diagnosis not present

## 2023-07-29 DIAGNOSIS — E538 Deficiency of other specified B group vitamins: Secondary | ICD-10-CM

## 2023-07-29 DIAGNOSIS — I10 Essential (primary) hypertension: Secondary | ICD-10-CM

## 2023-07-29 DIAGNOSIS — R7302 Impaired glucose tolerance (oral): Secondary | ICD-10-CM | POA: Diagnosis not present

## 2023-07-29 DIAGNOSIS — Z0000 Encounter for general adult medical examination without abnormal findings: Secondary | ICD-10-CM

## 2023-07-29 LAB — VITAMIN D 25 HYDROXY (VIT D DEFICIENCY, FRACTURES): VITD: 39.24 ng/mL (ref 30.00–100.00)

## 2023-07-29 LAB — CBC WITH DIFFERENTIAL/PLATELET
Basophils Absolute: 0.1 10*3/uL (ref 0.0–0.1)
Basophils Relative: 0.9 % (ref 0.0–3.0)
Eosinophils Absolute: 0.2 10*3/uL (ref 0.0–0.7)
Eosinophils Relative: 3.2 % (ref 0.0–5.0)
HCT: 43.9 % (ref 39.0–52.0)
Hemoglobin: 14.7 g/dL (ref 13.0–17.0)
Lymphocytes Relative: 28.7 % (ref 12.0–46.0)
Lymphs Abs: 1.7 10*3/uL (ref 0.7–4.0)
MCHC: 33.5 g/dL (ref 30.0–36.0)
MCV: 86 fl (ref 78.0–100.0)
Monocytes Absolute: 0.6 10*3/uL (ref 0.1–1.0)
Monocytes Relative: 9.2 % (ref 3.0–12.0)
Neutro Abs: 3.4 10*3/uL (ref 1.4–7.7)
Neutrophils Relative %: 58 % (ref 43.0–77.0)
Platelets: 219 10*3/uL (ref 150.0–400.0)
RBC: 5.11 Mil/uL (ref 4.22–5.81)
RDW: 14.3 % (ref 11.5–15.5)
WBC: 6 10*3/uL (ref 4.0–10.5)

## 2023-07-29 LAB — HEMOGLOBIN A1C: Hgb A1c MFr Bld: 6 % (ref 4.6–6.5)

## 2023-07-29 LAB — POCT GLYCOSYLATED HEMOGLOBIN (HGB A1C): Hemoglobin A1C: 5.7 % — AB (ref 4.0–5.6)

## 2023-07-29 NOTE — Progress Notes (Signed)
 Established Patient Office Visit     CC/Reason for Visit: Follow-up chronic conditions  HPI: Willie Ferguson is a 58 y.o. male who is coming in today for the above mentioned reasons. Past Medical History is significant for: Impaired glucose tolerance, vitamin D  and B12 deficiencies, hypertension, erectile dysfunction.  Doing well.  He completed 12 weeks of high-dose vitamin D  supplementation and is here to follow-up on levels.   Past Medical/Surgical History: Past Medical History:  Diagnosis Date   Snoring     History reviewed. No pertinent surgical history.  Social History:  reports that he has never smoked. He has never used smokeless tobacco. He reports that he does not drink alcohol and does not use drugs.  Allergies: No Known Allergies  Family History:  Family History  Problem Relation Age of Onset   Hypertension Mother    Hypertension Brother    Hypertension Brother      Current Outpatient Medications:    amLODipine  (NORVASC ) 5 MG tablet, Take 1 tablet (5 mg total) by mouth daily., Disp: 90 tablet, Rfl: 1   cyanocobalamin  (VITAMIN B12) 1000 MCG/ML injection, Inject 1 mL (1,000 mcg total) into the muscle every 30 (thirty) days., Disp: 6 mL, Rfl: 11   cyclobenzaprine  (FLEXERIL ) 5 MG tablet, Take 1 tablet (5 mg total) by mouth at bedtime as needed for muscle spasms., Disp: 30 tablet, Rfl: 1   meloxicam  (MOBIC ) 7.5 MG tablet, Take 1 tablet by mouth once daily, Disp: 30 tablet, Rfl: 0   sildenafil  (VIAGRA ) 50 MG tablet, Take 1 tablet (50 mg total) by mouth daily., Disp: 90 tablet, Rfl: 1   SYRINGE-NEEDLE, DISP, 3 ML (BD ECLIPSE SYRINGE) 25G X 1" 3 ML MISC, 1 each by Does not apply route as directed., Disp: 100 each, Rfl: 7   valsartan -hydrochlorothiazide  (DIOVAN -HCT) 160-25 MG tablet, Take 1 tablet by mouth daily., Disp: 90 tablet, Rfl: 1  Review of Systems:  Negative unless indicated in HPI.   Physical Exam: Vitals:   07/29/23 0910  BP: 120/72  Pulse: 69   Resp: 16  Temp: 97.7 F (36.5 C)  TempSrc: Oral  SpO2: 97%  Weight: 205 lb 12.8 oz (93.4 kg)  Height: 5\' 7"  (1.702 m)    Body mass index is 32.23 kg/m.   Physical Exam Vitals reviewed.  Constitutional:      Appearance: Normal appearance.  HENT:     Head: Normocephalic and atraumatic.  Eyes:     Conjunctiva/sclera: Conjunctivae normal.  Cardiovascular:     Rate and Rhythm: Normal rate and regular rhythm.  Pulmonary:     Effort: Pulmonary effort is normal.     Breath sounds: Normal breath sounds.  Skin:    General: Skin is warm and dry.  Neurological:     General: No focal deficit present.     Mental Status: He is alert and oriented to person, place, and time.  Psychiatric:        Mood and Affect: Mood normal.        Behavior: Behavior normal.        Thought Content: Thought content normal.        Judgment: Judgment normal.      Impression and Plan:  IGT (impaired glucose tolerance) -     POCT glycosylated hemoglobin (Hb A1C)  Primary hypertension  Vitamin B12 deficiency  Vitamin D  deficiency   - A1c is stable in the prediabetic range at 5.7. - Blood pressure is well-controlled. - Check vitamin D   levels.  Time spent:31 minutes reviewing chart, interviewing and examining patient and formulating plan of care.     Marguerita Shih, MD [facility] at [facility]

## 2023-08-26 ENCOUNTER — Other Ambulatory Visit: Payer: Self-pay | Admitting: Internal Medicine

## 2023-08-26 DIAGNOSIS — N529 Male erectile dysfunction, unspecified: Secondary | ICD-10-CM

## 2023-09-07 ENCOUNTER — Other Ambulatory Visit: Payer: Self-pay | Admitting: Internal Medicine

## 2023-09-07 DIAGNOSIS — N529 Male erectile dysfunction, unspecified: Secondary | ICD-10-CM

## 2023-09-07 NOTE — Telephone Encounter (Signed)
 Copied from CRM [id number]. Topic: Clinical - Medication Refill >> Sep 07, 2023  5:21 PM Baldo Levan wrote: Medication: sildenafil  (VIAGRA ) 50 MG tablet [[id number]]  Patient is asking if the MG can be increased.   Has the patient contacted their pharmacy? Yes (Agent: If no, request that the patient contact the pharmacy for the refill. If patient does not wish to contact the pharmacy document the reason why and proceed with request.) (Agent: If yes, when and what did the pharmacy advise?)  This is the patient's preferred pharmacy:  [facility] [address] ([address]  - [address] [place] ([place]) [place] [address] Phone: [phone] Fax: [phone]  Is this the correct pharmacy for this prescription? Yes If no, delete pharmacy and type the correct one.   Has the prescription been filled recently? No  Is the patient out of the medication? Yes  Has the patient been seen for an appointment in the last year OR does the patient have an upcoming appointment? Yes  Can we respond through MyChart? No  Agent: Please be advised that Rx refills may take up to 3 business days. We ask that you follow-up with your pharmacy.

## 2023-09-08 MED ORDER — SILDENAFIL CITRATE 50 MG PO TABS: 50.0000 mg | ORAL_TABLET | Freq: Every day | ORAL | 1 refills | Status: DC

## 2023-12-02 ENCOUNTER — Other Ambulatory Visit: Payer: Self-pay | Admitting: Internal Medicine

## 2023-12-02 DIAGNOSIS — N529 Male erectile dysfunction, unspecified: Secondary | ICD-10-CM

## 2023-12-02 MED ORDER — SILDENAFIL CITRATE 50 MG PO TABS: 50.0000 mg | ORAL_TABLET | Freq: Every day | ORAL | 1 refills | Status: AC

## 2023-12-02 NOTE — Telephone Encounter (Signed)
 Copied from CRM #[id number]. Topic: Clinical - Medication Refill >> Dec 02, 2023 10:58 AM Rosina BIRCH wrote: Medication: sildenafil  (VIAGRA ) 50 MG tablet  Has the patient contacted their pharmacy? Yes (Agent: If no, request that the patient contact the pharmacy for the refill. If patient does not wish to contact the pharmacy document the reason why and proceed with request.) (Agent: If yes, when and what did the pharmacy advise?)  This is the patient's preferred pharmacy:  [facility] [address] ([address]), [place] [address] [place] ([place]) [place] [address] Phone: [phone] Fax: [phone]  Is this the correct pharmacy for this prescription? Yes If no, delete pharmacy and type the correct one.   Has the prescription been filled recently? Yes  Is the patient out of the medication? Yes  Has the patient been seen for an appointment in the last year OR does the patient have an upcoming appointment? Yes  Can we respond through MyChart? Yes  Agent: Please be advised that Rx refills may take up to 3 business days. We ask that you follow-up with your pharmacy.

## 2023-12-14 ENCOUNTER — Telehealth: Admitting: Family Medicine

## 2023-12-14 ENCOUNTER — Ambulatory Visit: Payer: Self-pay

## 2023-12-14 ENCOUNTER — Encounter: Payer: Self-pay | Admitting: Family Medicine

## 2023-12-14 ENCOUNTER — Ambulatory Visit: Admitting: Family Medicine

## 2023-12-14 VITALS — Wt 207.0 lb

## 2023-12-14 DIAGNOSIS — U071 COVID-19: Secondary | ICD-10-CM

## 2023-12-14 DIAGNOSIS — R051 Acute cough: Secondary | ICD-10-CM | POA: Diagnosis not present

## 2023-12-14 MED ORDER — BENZONATATE 100 MG PO CAPS: 100.0000 mg | ORAL_CAPSULE | Freq: Two times a day (BID) | ORAL | 0 refills | Status: AC | PRN

## 2023-12-14 MED ORDER — NIRMATRELVIR/RITONAVIR (PAXLOVID)TABLET: 3.0000 | ORAL_TABLET | Freq: Two times a day (BID) | ORAL | 0 refills | Status: AC

## 2023-12-14 NOTE — Progress Notes (Signed)
 Virtual Visit via Video Note I connected with Willie Ferguson on 12/14/23 by a video enabled telemedicine application and verified that I am speaking with the correct person using two identifiers. Location patient: home Location provider:work office Persons participating in the virtual visit: patient, provider  I discussed the limitations of evaluation and management by telemedicine and the availability of in person appointments. The patient expressed understanding and agreed to proceed.  Chief Complaint  Patient presents with   Covid Positive    Last night tested positive covid. Scratchy throat, coughing, hurts on the side, chills, body aches. Denied weakness. Sx started yesterday.  Temperature last 102.Took tynenol   Discussed the use of AI scribe software for clinical note transcription with the patient, who gave verbal consent to proceed.  History of Present Illness Willie Ferguson is a 58 year old male with past medical history significant for ED, impaired glucose intolerance, and vitamin D  deficiency hypertension who presents with symptoms of COVID-19.  He tested positive for COVID-19 last night.  Symptoms began yesterday and include a scratchy throat, coughing, body aches, chills, and a fever with a temperature of 102F. He describes the current episode as less severe than a previous COVID-19 infection.  Negative for headache, rhinorrhea , nasal congestion, sore throat, difficulty breathing, chest pain, wheezing, abdominal pain, nausea, or vomiting. Nonproductive cough. He has not noted skin rash.  He has been taking Tylenol to manage his symptoms.  For hypertension and is currently taking amlodipine  5 mg daily and valsartan -HCTZ 160-25 mg daily. No history of kidney or liver disease. ED on Viagra  50 mg daily as needed.  ROS: See pertinent positives and negatives per HPI.  Past Medical History:  Diagnosis Date   Snoring     No past surgical history on file.  Family  History  Problem Relation Age of Onset   Hypertension Mother    Hypertension Brother    Hypertension Brother     Social History   Socioeconomic History   Marital status: Unknown    Spouse name: Not on file   Number of children: Not on file   Years of education: Not on file   Highest education level: Not on file  Occupational History    Employer: [employer]  Tobacco Use   Smoking status: Never   Smokeless tobacco: Never  Substance and Sexual Activity   Alcohol use: No    Alcohol/week: 0.0 standard drinks of alcohol   Drug use: No   Sexual activity: Yes  Other Topics Concern   Not on file  Social History Narrative   Drinks 3 16oz sodas daily.   Social Drivers of Corporate investment banker Strain: Not on file  Food Insecurity: Not on file  Transportation Needs: Not on file  Physical Activity: Not on file  Stress: Not on file  Social Connections: Not on file  Intimate Partner Violence: Not on file    Current Outpatient Medications:    amLODipine  (NORVASC ) 5 MG tablet, Take 1 tablet (5 mg total) by mouth daily., Disp: 90 tablet, Rfl: 1   benzonatate  (TESSALON ) 100 MG capsule, Take 1 capsule (100 mg total) by mouth 2 (two) times daily as needed for up to 10 days., Disp: 20 capsule, Rfl: 0   cyanocobalamin  (VITAMIN B12) 1000 MCG/ML injection, Inject 1 mL (1,000 mcg total) into the muscle every 30 (thirty) days., Disp: 6 mL, Rfl: 11   cyclobenzaprine  (FLEXERIL ) 5 MG tablet, Take 1 tablet (5 mg total) by  mouth at bedtime as needed for muscle spasms., Disp: 30 tablet, Rfl: 1   meloxicam  (MOBIC ) 7.5 MG tablet, Take 1 tablet by mouth once daily, Disp: 30 tablet, Rfl: 0   nirmatrelvir /ritonavir  (PAXLOVID ) 20 x 150 MG & 10 x 100MG  TABS, Take 3 tablets by mouth 2 (two) times daily for 5 days. (Take nirmatrelvir  150 mg two tablets twice daily for 5 days and ritonavir  100 mg one tablet twice daily for 5 days) Patient GFR is 78 in 04/2023., Disp: 30 tablet, Rfl: 0   sildenafil   (VIAGRA ) 50 MG tablet, Take 1 tablet (50 mg total) by mouth daily., Disp: 90 tablet, Rfl: 1   SYRINGE-NEEDLE, DISP, 3 ML (BD ECLIPSE SYRINGE) 25G X 1 3 ML MISC, 1 each by Does not apply route as directed., Disp: 100 each, Rfl: 7   valsartan -hydrochlorothiazide  (DIOVAN -HCT) 160-25 MG tablet, Take 1 tablet by mouth daily., Disp: 90 tablet, Rfl: 1  EXAM:  VITALS per patient if applicable:Wt 207 lb (93.9 kg)   BMI 32.42 kg/m   GENERAL: alert, oriented, appears well and in no acute distress  HEENT: atraumatic, conjunctiva clear, no obvious abnormalities on inspection of external nose and ears  NECK: normal movements of the head and neck  LUNGS: on inspection no signs of respiratory distress, breathing rate appears normal, no obvious gross SOB, gasping or wheezing  CV: no obvious cyanosis  MS: moves all visible extremities without noticeable abnormality  PSYCH/NEURO: pleasant and cooperative, no obvious depression or anxiety, speech and thought processing grossly intact  ASSESSMENT AND PLAN:  Discussed the following assessment and plan:  COVID-19 virus infection We discussed Dx,possible complications and treatment options. He has a mild to moderate case with some risk for complications. We discussed oral antiviral options and side effects. He agrees with starting Paxlovid .  He was instructed to hold on Viagra  while taking medication due to risk of med interaction. Symptomatic treatment with plenty of fluids,rest,tylenol 500 mg 3-4 times per day prn.  Explained that cough and congestion may last a few more days and even weeks after acute symptoms have resolved. We discussed current recommendations in regard to isolation, he can go back to work after 24 to 48 hours fever free.  Excuse for work given to go back next Monday, before if fever resolves and symptoms improved. Clearly instructed about warning signs.  -     nirmatrelvir /ritonavir ; Take 3 tablets by mouth 2 (two) times daily  for 5 days. (Take nirmatrelvir  150 mg two tablets twice daily for 5 days and ritonavir  100 mg one tablet twice daily for 5 days) Patient GFR is 78 in 04/2023.  Dispense: 30 tablet; Refill: 0  Acute cough Symptomatic treatment with benzonatate  recommended. Continue adequate hydration.  -     Benzonatate ; Take 1 capsule (100 mg total) by mouth 2 (two) times daily as needed for up to 10 days.  Dispense: 20 capsule; Refill: 0  We discussed possible serious and likely etiologies, options for evaluation and workup, limitations of telemedicine visit vs in person visit, treatment, treatment risks and precautions. The patient was advised to call back or seek an in-person evaluation if the symptoms worsen or if the condition fails to improve as anticipated. I discussed the assessment and treatment plan with the patient. The patient was provided an opportunity to ask questions and all were answered. The patient agreed with the plan and demonstrated an understanding of the instructions.  Return if symptoms worsen or fail to improve.  Shanen Norris [place], MD

## 2023-12-14 NOTE — Telephone Encounter (Addendum)
  Third attempt no answer Second attempt; no answer First attempt; no answer  FYI Only or Action Required?: Action required by provider: wants rx for Covid.  Patient was last seen in primary care on 07/29/2023 by Theophilus Andrews, Tully GRADE, MD.  Called Nurse Triage reporting No chief complaint on file..  Symptoms began today.  Interventions attempted: Nothing.  Symptoms are: unchanged.  Triage Disposition: No disposition on file.  Patient/caregiver understands and will follow disposition?:    Copied from CRM [id number]. Topic: Clinical - Medication Question >> Dec 14, 2023  7:38 AM Suzen RAMAN wrote: Reason for CRM: Patient wife would like to know if patient can be prescribed Paxlovid  since he tested positive for COVID yesterday or if he needs to come in for an OV. Please call to advise.    [phone]

## 2023-12-15 NOTE — Telephone Encounter (Signed)
 Spoke to the patient and he had a virtual visit 12/14/23 with Dr [place].
